# Patient Record
Sex: Female | Born: 1945
Health system: Southern US, Community
[De-identification: ages and names within clinical notes are randomized; demographics above are authoritative.]

## PROBLEM LIST (undated history)

## (undated) DIAGNOSIS — J449 Chronic obstructive pulmonary disease, unspecified: Secondary | ICD-10-CM

## (undated) DIAGNOSIS — F329 Major depressive disorder, single episode, unspecified: Secondary | ICD-10-CM

## (undated) DIAGNOSIS — F32A Depression, unspecified: Secondary | ICD-10-CM

## (undated) DIAGNOSIS — I10 Essential (primary) hypertension: Secondary | ICD-10-CM

## (undated) DIAGNOSIS — E785 Hyperlipidemia, unspecified: Secondary | ICD-10-CM

## (undated) DIAGNOSIS — N183 Chronic kidney disease, stage 3 unspecified: Secondary | ICD-10-CM

## (undated) DIAGNOSIS — M48061 Spinal stenosis, lumbar region without neurogenic claudication: Secondary | ICD-10-CM

## (undated) DIAGNOSIS — N8110 Cystocele, unspecified: Secondary | ICD-10-CM

## (undated) HISTORY — DX: Spinal stenosis, lumbar region without neurogenic claudication: M48.061

## (undated) HISTORY — DX: Hyperlipidemia, unspecified: E78.5

## (undated) HISTORY — DX: Depression, unspecified: F32.A

## (undated) HISTORY — PX: OTHER SURGICAL HISTORY: SHX169

## (undated) HISTORY — DX: Essential (primary) hypertension: I10

## (undated) HISTORY — PX: CERVICAL LAMINECTOMY: SHX94

## (undated) HISTORY — DX: Cystocele, unspecified: N81.10

## (undated) HISTORY — DX: Chronic obstructive pulmonary disease, unspecified: J44.9

## (undated) HISTORY — DX: Chronic kidney disease, stage 3 unspecified: N18.30

---
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
## 1998-04-24 ENCOUNTER — Ambulatory Visit (HOSPITAL_COMMUNITY): Admission: RE | Admit: 1998-04-24 | Discharge: 1998-04-24 | Payer: Self-pay | Admitting: Orthopedic Surgery

## 2011-10-29 DIAGNOSIS — Z1231 Encounter for screening mammogram for malignant neoplasm of breast: Secondary | ICD-10-CM | POA: Diagnosis not present

## 2011-11-16 DIAGNOSIS — L570 Actinic keratosis: Secondary | ICD-10-CM | POA: Diagnosis not present

## 2011-12-01 DIAGNOSIS — R5383 Other fatigue: Secondary | ICD-10-CM | POA: Diagnosis not present

## 2011-12-01 DIAGNOSIS — R5381 Other malaise: Secondary | ICD-10-CM | POA: Diagnosis not present

## 2011-12-02 DIAGNOSIS — H40019 Open angle with borderline findings, low risk, unspecified eye: Secondary | ICD-10-CM | POA: Diagnosis not present

## 2011-12-02 DIAGNOSIS — H04129 Dry eye syndrome of unspecified lacrimal gland: Secondary | ICD-10-CM | POA: Diagnosis not present

## 2011-12-07 DIAGNOSIS — N952 Postmenopausal atrophic vaginitis: Secondary | ICD-10-CM | POA: Diagnosis not present

## 2011-12-07 DIAGNOSIS — N814 Uterovaginal prolapse, unspecified: Secondary | ICD-10-CM | POA: Diagnosis not present

## 2011-12-09 DIAGNOSIS — Z0000 Encounter for general adult medical examination without abnormal findings: Secondary | ICD-10-CM | POA: Diagnosis not present

## 2011-12-13 DIAGNOSIS — E782 Mixed hyperlipidemia: Secondary | ICD-10-CM | POA: Diagnosis not present

## 2011-12-13 DIAGNOSIS — I10 Essential (primary) hypertension: Secondary | ICD-10-CM | POA: Diagnosis not present

## 2011-12-13 DIAGNOSIS — F341 Dysthymic disorder: Secondary | ICD-10-CM | POA: Diagnosis not present

## 2011-12-13 DIAGNOSIS — Z79899 Other long term (current) drug therapy: Secondary | ICD-10-CM | POA: Diagnosis not present

## 2011-12-13 DIAGNOSIS — Z1331 Encounter for screening for depression: Secondary | ICD-10-CM | POA: Diagnosis not present

## 2011-12-24 DIAGNOSIS — M5412 Radiculopathy, cervical region: Secondary | ICD-10-CM | POA: Diagnosis not present

## 2011-12-28 DIAGNOSIS — IMO0002 Reserved for concepts with insufficient information to code with codable children: Secondary | ICD-10-CM | POA: Diagnosis not present

## 2011-12-28 DIAGNOSIS — M9981 Other biomechanical lesions of cervical region: Secondary | ICD-10-CM | POA: Diagnosis not present

## 2011-12-28 DIAGNOSIS — M5030 Other cervical disc degeneration, unspecified cervical region: Secondary | ICD-10-CM | POA: Diagnosis not present

## 2011-12-28 DIAGNOSIS — M999 Biomechanical lesion, unspecified: Secondary | ICD-10-CM | POA: Diagnosis not present

## 2011-12-30 DIAGNOSIS — M9981 Other biomechanical lesions of cervical region: Secondary | ICD-10-CM | POA: Diagnosis not present

## 2011-12-30 DIAGNOSIS — M5030 Other cervical disc degeneration, unspecified cervical region: Secondary | ICD-10-CM | POA: Diagnosis not present

## 2011-12-30 DIAGNOSIS — M999 Biomechanical lesion, unspecified: Secondary | ICD-10-CM | POA: Diagnosis not present

## 2011-12-30 DIAGNOSIS — IMO0002 Reserved for concepts with insufficient information to code with codable children: Secondary | ICD-10-CM | POA: Diagnosis not present

## 2012-01-04 DIAGNOSIS — IMO0002 Reserved for concepts with insufficient information to code with codable children: Secondary | ICD-10-CM | POA: Diagnosis not present

## 2012-01-04 DIAGNOSIS — M9981 Other biomechanical lesions of cervical region: Secondary | ICD-10-CM | POA: Diagnosis not present

## 2012-01-04 DIAGNOSIS — Z981 Arthrodesis status: Secondary | ICD-10-CM | POA: Diagnosis not present

## 2012-01-04 DIAGNOSIS — M5030 Other cervical disc degeneration, unspecified cervical region: Secondary | ICD-10-CM | POA: Diagnosis not present

## 2012-01-04 DIAGNOSIS — M5020 Other cervical disc displacement, unspecified cervical region: Secondary | ICD-10-CM | POA: Diagnosis not present

## 2012-01-04 DIAGNOSIS — M999 Biomechanical lesion, unspecified: Secondary | ICD-10-CM | POA: Diagnosis not present

## 2012-01-04 DIAGNOSIS — M47812 Spondylosis without myelopathy or radiculopathy, cervical region: Secondary | ICD-10-CM | POA: Diagnosis not present

## 2012-01-05 DIAGNOSIS — Z0000 Encounter for general adult medical examination without abnormal findings: Secondary | ICD-10-CM | POA: Diagnosis not present

## 2012-01-05 DIAGNOSIS — Z01419 Encounter for gynecological examination (general) (routine) without abnormal findings: Secondary | ICD-10-CM | POA: Diagnosis not present

## 2012-01-05 DIAGNOSIS — Z1231 Encounter for screening mammogram for malignant neoplasm of breast: Secondary | ICD-10-CM | POA: Diagnosis not present

## 2012-01-10 DIAGNOSIS — F329 Major depressive disorder, single episode, unspecified: Secondary | ICD-10-CM | POA: Diagnosis not present

## 2012-01-10 DIAGNOSIS — M542 Cervicalgia: Secondary | ICD-10-CM | POA: Diagnosis not present

## 2012-01-10 DIAGNOSIS — Z130 Encounter for screening for diseases of the blood and blood-forming organs and certain disorders involving the immune mechanism: Secondary | ICD-10-CM | POA: Diagnosis not present

## 2012-01-10 DIAGNOSIS — Z1329 Encounter for screening for other suspected endocrine disorder: Secondary | ICD-10-CM | POA: Diagnosis not present

## 2012-01-10 DIAGNOSIS — F3289 Other specified depressive episodes: Secondary | ICD-10-CM | POA: Diagnosis not present

## 2012-01-10 DIAGNOSIS — E785 Hyperlipidemia, unspecified: Secondary | ICD-10-CM | POA: Diagnosis not present

## 2012-01-26 DIAGNOSIS — L570 Actinic keratosis: Secondary | ICD-10-CM | POA: Diagnosis not present

## 2012-02-02 DIAGNOSIS — R5383 Other fatigue: Secondary | ICD-10-CM | POA: Diagnosis not present

## 2012-02-02 DIAGNOSIS — R5381 Other malaise: Secondary | ICD-10-CM | POA: Diagnosis not present

## 2012-02-06 DIAGNOSIS — H81399 Other peripheral vertigo, unspecified ear: Secondary | ICD-10-CM | POA: Diagnosis not present

## 2012-02-15 DIAGNOSIS — J301 Allergic rhinitis due to pollen: Secondary | ICD-10-CM | POA: Diagnosis not present

## 2012-02-15 DIAGNOSIS — H8110 Benign paroxysmal vertigo, unspecified ear: Secondary | ICD-10-CM | POA: Diagnosis not present

## 2012-02-21 DIAGNOSIS — M545 Low back pain, unspecified: Secondary | ICD-10-CM | POA: Diagnosis not present

## 2012-02-23 DIAGNOSIS — H8110 Benign paroxysmal vertigo, unspecified ear: Secondary | ICD-10-CM | POA: Diagnosis not present

## 2012-02-23 DIAGNOSIS — IMO0001 Reserved for inherently not codable concepts without codable children: Secondary | ICD-10-CM | POA: Diagnosis not present

## 2012-02-23 DIAGNOSIS — R269 Unspecified abnormalities of gait and mobility: Secondary | ICD-10-CM | POA: Diagnosis not present

## 2012-02-29 DIAGNOSIS — Z1211 Encounter for screening for malignant neoplasm of colon: Secondary | ICD-10-CM | POA: Diagnosis not present

## 2012-02-29 DIAGNOSIS — N8182 Incompetence or weakening of pubocervical tissue: Secondary | ICD-10-CM | POA: Diagnosis not present

## 2012-02-29 DIAGNOSIS — N393 Stress incontinence (female) (male): Secondary | ICD-10-CM | POA: Diagnosis not present

## 2012-03-01 DIAGNOSIS — R5383 Other fatigue: Secondary | ICD-10-CM | POA: Diagnosis not present

## 2012-03-01 DIAGNOSIS — R5381 Other malaise: Secondary | ICD-10-CM | POA: Diagnosis not present

## 2012-03-02 DIAGNOSIS — IMO0002 Reserved for concepts with insufficient information to code with codable children: Secondary | ICD-10-CM | POA: Diagnosis not present

## 2012-03-02 DIAGNOSIS — M5030 Other cervical disc degeneration, unspecified cervical region: Secondary | ICD-10-CM | POA: Diagnosis not present

## 2012-03-02 DIAGNOSIS — M999 Biomechanical lesion, unspecified: Secondary | ICD-10-CM | POA: Diagnosis not present

## 2012-03-02 DIAGNOSIS — M9981 Other biomechanical lesions of cervical region: Secondary | ICD-10-CM | POA: Diagnosis not present

## 2012-03-04 DIAGNOSIS — IMO0002 Reserved for concepts with insufficient information to code with codable children: Secondary | ICD-10-CM | POA: Diagnosis not present

## 2012-03-04 DIAGNOSIS — M999 Biomechanical lesion, unspecified: Secondary | ICD-10-CM | POA: Diagnosis not present

## 2012-03-04 DIAGNOSIS — M5030 Other cervical disc degeneration, unspecified cervical region: Secondary | ICD-10-CM | POA: Diagnosis not present

## 2012-03-04 DIAGNOSIS — M9981 Other biomechanical lesions of cervical region: Secondary | ICD-10-CM | POA: Diagnosis not present

## 2012-03-07 DIAGNOSIS — IMO0002 Reserved for concepts with insufficient information to code with codable children: Secondary | ICD-10-CM | POA: Diagnosis not present

## 2012-03-07 DIAGNOSIS — M999 Biomechanical lesion, unspecified: Secondary | ICD-10-CM | POA: Diagnosis not present

## 2012-03-07 DIAGNOSIS — M9981 Other biomechanical lesions of cervical region: Secondary | ICD-10-CM | POA: Diagnosis not present

## 2012-03-07 DIAGNOSIS — M5030 Other cervical disc degeneration, unspecified cervical region: Secondary | ICD-10-CM | POA: Diagnosis not present

## 2012-03-25 DIAGNOSIS — R51 Headache: Secondary | ICD-10-CM | POA: Diagnosis not present

## 2012-03-25 DIAGNOSIS — H81399 Other peripheral vertigo, unspecified ear: Secondary | ICD-10-CM | POA: Diagnosis not present

## 2012-03-29 DIAGNOSIS — R51 Headache: Secondary | ICD-10-CM | POA: Diagnosis not present

## 2012-03-29 DIAGNOSIS — R42 Dizziness and giddiness: Secondary | ICD-10-CM | POA: Diagnosis not present

## 2012-04-03 DIAGNOSIS — H5310 Unspecified subjective visual disturbances: Secondary | ICD-10-CM | POA: Diagnosis not present

## 2012-04-03 DIAGNOSIS — H43819 Vitreous degeneration, unspecified eye: Secondary | ICD-10-CM | POA: Diagnosis not present

## 2012-04-03 DIAGNOSIS — H04129 Dry eye syndrome of unspecified lacrimal gland: Secondary | ICD-10-CM | POA: Diagnosis not present

## 2012-04-03 DIAGNOSIS — H52209 Unspecified astigmatism, unspecified eye: Secondary | ICD-10-CM | POA: Diagnosis not present

## 2012-04-06 DIAGNOSIS — E782 Mixed hyperlipidemia: Secondary | ICD-10-CM | POA: Diagnosis not present

## 2012-04-06 DIAGNOSIS — M999 Biomechanical lesion, unspecified: Secondary | ICD-10-CM | POA: Diagnosis not present

## 2012-04-06 DIAGNOSIS — F329 Major depressive disorder, single episode, unspecified: Secondary | ICD-10-CM | POA: Diagnosis not present

## 2012-04-06 DIAGNOSIS — M5030 Other cervical disc degeneration, unspecified cervical region: Secondary | ICD-10-CM | POA: Diagnosis not present

## 2012-04-06 DIAGNOSIS — I10 Essential (primary) hypertension: Secondary | ICD-10-CM | POA: Diagnosis not present

## 2012-04-06 DIAGNOSIS — IMO0002 Reserved for concepts with insufficient information to code with codable children: Secondary | ICD-10-CM | POA: Diagnosis not present

## 2012-04-06 DIAGNOSIS — M9981 Other biomechanical lesions of cervical region: Secondary | ICD-10-CM | POA: Diagnosis not present

## 2012-04-13 DIAGNOSIS — M5030 Other cervical disc degeneration, unspecified cervical region: Secondary | ICD-10-CM | POA: Diagnosis not present

## 2012-04-13 DIAGNOSIS — IMO0002 Reserved for concepts with insufficient information to code with codable children: Secondary | ICD-10-CM | POA: Diagnosis not present

## 2012-04-13 DIAGNOSIS — M999 Biomechanical lesion, unspecified: Secondary | ICD-10-CM | POA: Diagnosis not present

## 2012-04-13 DIAGNOSIS — M9981 Other biomechanical lesions of cervical region: Secondary | ICD-10-CM | POA: Diagnosis not present

## 2012-04-14 DIAGNOSIS — M5412 Radiculopathy, cervical region: Secondary | ICD-10-CM | POA: Diagnosis not present

## 2012-04-14 DIAGNOSIS — H8110 Benign paroxysmal vertigo, unspecified ear: Secondary | ICD-10-CM | POA: Diagnosis not present

## 2012-06-08 DIAGNOSIS — IMO0002 Reserved for concepts with insufficient information to code with codable children: Secondary | ICD-10-CM | POA: Diagnosis not present

## 2012-06-08 DIAGNOSIS — M999 Biomechanical lesion, unspecified: Secondary | ICD-10-CM | POA: Diagnosis not present

## 2012-06-08 DIAGNOSIS — M5030 Other cervical disc degeneration, unspecified cervical region: Secondary | ICD-10-CM | POA: Diagnosis not present

## 2012-06-08 DIAGNOSIS — M9981 Other biomechanical lesions of cervical region: Secondary | ICD-10-CM | POA: Diagnosis not present

## 2012-06-12 DIAGNOSIS — M999 Biomechanical lesion, unspecified: Secondary | ICD-10-CM | POA: Diagnosis not present

## 2012-06-12 DIAGNOSIS — IMO0002 Reserved for concepts with insufficient information to code with codable children: Secondary | ICD-10-CM | POA: Diagnosis not present

## 2012-06-12 DIAGNOSIS — M5030 Other cervical disc degeneration, unspecified cervical region: Secondary | ICD-10-CM | POA: Diagnosis not present

## 2012-06-12 DIAGNOSIS — M9981 Other biomechanical lesions of cervical region: Secondary | ICD-10-CM | POA: Diagnosis not present

## 2012-06-23 DIAGNOSIS — Z23 Encounter for immunization: Secondary | ICD-10-CM | POA: Diagnosis not present

## 2012-06-23 DIAGNOSIS — E782 Mixed hyperlipidemia: Secondary | ICD-10-CM | POA: Diagnosis not present

## 2012-06-23 DIAGNOSIS — F341 Dysthymic disorder: Secondary | ICD-10-CM | POA: Diagnosis not present

## 2012-06-23 DIAGNOSIS — R002 Palpitations: Secondary | ICD-10-CM | POA: Diagnosis not present

## 2012-06-29 DIAGNOSIS — J449 Chronic obstructive pulmonary disease, unspecified: Secondary | ICD-10-CM | POA: Diagnosis not present

## 2012-07-17 DIAGNOSIS — R5383 Other fatigue: Secondary | ICD-10-CM | POA: Diagnosis not present

## 2012-07-17 DIAGNOSIS — R5381 Other malaise: Secondary | ICD-10-CM | POA: Diagnosis not present

## 2012-07-25 DIAGNOSIS — R5381 Other malaise: Secondary | ICD-10-CM | POA: Diagnosis not present

## 2012-07-25 DIAGNOSIS — R5383 Other fatigue: Secondary | ICD-10-CM | POA: Diagnosis not present

## 2012-07-31 DIAGNOSIS — R5381 Other malaise: Secondary | ICD-10-CM | POA: Diagnosis not present

## 2012-08-28 DIAGNOSIS — R05 Cough: Secondary | ICD-10-CM | POA: Diagnosis not present

## 2012-08-28 DIAGNOSIS — F411 Generalized anxiety disorder: Secondary | ICD-10-CM | POA: Diagnosis not present

## 2012-08-28 DIAGNOSIS — J3089 Other allergic rhinitis: Secondary | ICD-10-CM | POA: Diagnosis not present

## 2012-08-28 DIAGNOSIS — R059 Cough, unspecified: Secondary | ICD-10-CM | POA: Diagnosis not present

## 2012-09-06 DIAGNOSIS — J3489 Other specified disorders of nose and nasal sinuses: Secondary | ICD-10-CM | POA: Diagnosis not present

## 2012-09-21 DIAGNOSIS — M5030 Other cervical disc degeneration, unspecified cervical region: Secondary | ICD-10-CM | POA: Diagnosis not present

## 2012-09-21 DIAGNOSIS — M999 Biomechanical lesion, unspecified: Secondary | ICD-10-CM | POA: Diagnosis not present

## 2012-09-21 DIAGNOSIS — IMO0002 Reserved for concepts with insufficient information to code with codable children: Secondary | ICD-10-CM | POA: Diagnosis not present

## 2012-09-21 DIAGNOSIS — M9981 Other biomechanical lesions of cervical region: Secondary | ICD-10-CM | POA: Diagnosis not present

## 2012-09-25 DIAGNOSIS — L738 Other specified follicular disorders: Secondary | ICD-10-CM | POA: Diagnosis not present

## 2012-09-25 DIAGNOSIS — L821 Other seborrheic keratosis: Secondary | ICD-10-CM | POA: Diagnosis not present

## 2012-09-25 DIAGNOSIS — L570 Actinic keratosis: Secondary | ICD-10-CM | POA: Diagnosis not present

## 2012-10-17 DIAGNOSIS — F411 Generalized anxiety disorder: Secondary | ICD-10-CM | POA: Diagnosis not present

## 2012-10-17 DIAGNOSIS — J019 Acute sinusitis, unspecified: Secondary | ICD-10-CM | POA: Diagnosis not present

## 2012-10-17 DIAGNOSIS — J3089 Other allergic rhinitis: Secondary | ICD-10-CM | POA: Diagnosis not present

## 2012-10-26 DIAGNOSIS — M5030 Other cervical disc degeneration, unspecified cervical region: Secondary | ICD-10-CM | POA: Diagnosis not present

## 2012-10-26 DIAGNOSIS — M9981 Other biomechanical lesions of cervical region: Secondary | ICD-10-CM | POA: Diagnosis not present

## 2012-10-26 DIAGNOSIS — IMO0002 Reserved for concepts with insufficient information to code with codable children: Secondary | ICD-10-CM | POA: Diagnosis not present

## 2012-10-26 DIAGNOSIS — M999 Biomechanical lesion, unspecified: Secondary | ICD-10-CM | POA: Diagnosis not present

## 2012-11-01 DIAGNOSIS — F341 Dysthymic disorder: Secondary | ICD-10-CM | POA: Diagnosis not present

## 2012-11-01 DIAGNOSIS — I10 Essential (primary) hypertension: Secondary | ICD-10-CM | POA: Diagnosis not present

## 2012-11-01 DIAGNOSIS — Z832 Family history of diseases of the blood and blood-forming organs and certain disorders involving the immune mechanism: Secondary | ICD-10-CM | POA: Diagnosis not present

## 2012-11-01 DIAGNOSIS — E7800 Pure hypercholesterolemia, unspecified: Secondary | ICD-10-CM | POA: Diagnosis not present

## 2012-11-01 DIAGNOSIS — E559 Vitamin D deficiency, unspecified: Secondary | ICD-10-CM | POA: Diagnosis not present

## 2012-11-01 DIAGNOSIS — I951 Orthostatic hypotension: Secondary | ICD-10-CM | POA: Diagnosis not present

## 2012-11-01 DIAGNOSIS — E782 Mixed hyperlipidemia: Secondary | ICD-10-CM | POA: Diagnosis not present

## 2012-11-01 DIAGNOSIS — Z006 Encounter for examination for normal comparison and control in clinical research program: Secondary | ICD-10-CM | POA: Diagnosis not present

## 2012-11-07 DIAGNOSIS — Z1231 Encounter for screening mammogram for malignant neoplasm of breast: Secondary | ICD-10-CM | POA: Diagnosis not present

## 2012-11-22 DIAGNOSIS — E782 Mixed hyperlipidemia: Secondary | ICD-10-CM | POA: Diagnosis not present

## 2012-11-22 DIAGNOSIS — I10 Essential (primary) hypertension: Secondary | ICD-10-CM | POA: Diagnosis not present

## 2012-12-14 DIAGNOSIS — M5030 Other cervical disc degeneration, unspecified cervical region: Secondary | ICD-10-CM | POA: Diagnosis not present

## 2012-12-14 DIAGNOSIS — M9981 Other biomechanical lesions of cervical region: Secondary | ICD-10-CM | POA: Diagnosis not present

## 2012-12-14 DIAGNOSIS — M999 Biomechanical lesion, unspecified: Secondary | ICD-10-CM | POA: Diagnosis not present

## 2012-12-14 DIAGNOSIS — IMO0002 Reserved for concepts with insufficient information to code with codable children: Secondary | ICD-10-CM | POA: Diagnosis not present

## 2012-12-25 DIAGNOSIS — R748 Abnormal levels of other serum enzymes: Secondary | ICD-10-CM | POA: Diagnosis not present

## 2012-12-29 DIAGNOSIS — J019 Acute sinusitis, unspecified: Secondary | ICD-10-CM | POA: Diagnosis not present

## 2013-01-02 DIAGNOSIS — IMO0002 Reserved for concepts with insufficient information to code with codable children: Secondary | ICD-10-CM | POA: Diagnosis not present

## 2013-01-02 DIAGNOSIS — M5030 Other cervical disc degeneration, unspecified cervical region: Secondary | ICD-10-CM | POA: Diagnosis not present

## 2013-01-02 DIAGNOSIS — M9981 Other biomechanical lesions of cervical region: Secondary | ICD-10-CM | POA: Diagnosis not present

## 2013-01-02 DIAGNOSIS — R5381 Other malaise: Secondary | ICD-10-CM | POA: Diagnosis not present

## 2013-01-02 DIAGNOSIS — M999 Biomechanical lesion, unspecified: Secondary | ICD-10-CM | POA: Diagnosis not present

## 2013-01-05 DIAGNOSIS — H04129 Dry eye syndrome of unspecified lacrimal gland: Secondary | ICD-10-CM | POA: Diagnosis not present

## 2013-01-05 DIAGNOSIS — H16109 Unspecified superficial keratitis, unspecified eye: Secondary | ICD-10-CM | POA: Diagnosis not present

## 2013-01-05 DIAGNOSIS — H538 Other visual disturbances: Secondary | ICD-10-CM | POA: Diagnosis not present

## 2013-01-05 DIAGNOSIS — Z961 Presence of intraocular lens: Secondary | ICD-10-CM | POA: Diagnosis not present

## 2013-01-11 DIAGNOSIS — M19079 Primary osteoarthritis, unspecified ankle and foot: Secondary | ICD-10-CM | POA: Diagnosis not present

## 2013-01-11 DIAGNOSIS — G5760 Lesion of plantar nerve, unspecified lower limb: Secondary | ICD-10-CM | POA: Diagnosis not present

## 2013-01-11 DIAGNOSIS — M779 Enthesopathy, unspecified: Secondary | ICD-10-CM | POA: Diagnosis not present

## 2013-01-25 DIAGNOSIS — G5760 Lesion of plantar nerve, unspecified lower limb: Secondary | ICD-10-CM | POA: Diagnosis not present

## 2013-02-15 DIAGNOSIS — IMO0002 Reserved for concepts with insufficient information to code with codable children: Secondary | ICD-10-CM | POA: Diagnosis not present

## 2013-02-15 DIAGNOSIS — M5030 Other cervical disc degeneration, unspecified cervical region: Secondary | ICD-10-CM | POA: Diagnosis not present

## 2013-02-15 DIAGNOSIS — M9981 Other biomechanical lesions of cervical region: Secondary | ICD-10-CM | POA: Diagnosis not present

## 2013-02-15 DIAGNOSIS — G5760 Lesion of plantar nerve, unspecified lower limb: Secondary | ICD-10-CM | POA: Diagnosis not present

## 2013-02-15 DIAGNOSIS — M999 Biomechanical lesion, unspecified: Secondary | ICD-10-CM | POA: Diagnosis not present

## 2013-02-19 DIAGNOSIS — L570 Actinic keratosis: Secondary | ICD-10-CM | POA: Diagnosis not present

## 2013-02-19 DIAGNOSIS — L819 Disorder of pigmentation, unspecified: Secondary | ICD-10-CM | POA: Diagnosis not present

## 2013-02-19 DIAGNOSIS — L820 Inflamed seborrheic keratosis: Secondary | ICD-10-CM | POA: Diagnosis not present

## 2013-02-22 DIAGNOSIS — M999 Biomechanical lesion, unspecified: Secondary | ICD-10-CM | POA: Diagnosis not present

## 2013-02-22 DIAGNOSIS — IMO0002 Reserved for concepts with insufficient information to code with codable children: Secondary | ICD-10-CM | POA: Diagnosis not present

## 2013-02-22 DIAGNOSIS — M5030 Other cervical disc degeneration, unspecified cervical region: Secondary | ICD-10-CM | POA: Diagnosis not present

## 2013-02-22 DIAGNOSIS — M9981 Other biomechanical lesions of cervical region: Secondary | ICD-10-CM | POA: Diagnosis not present

## 2013-03-22 DIAGNOSIS — M999 Biomechanical lesion, unspecified: Secondary | ICD-10-CM | POA: Diagnosis not present

## 2013-03-22 DIAGNOSIS — M5030 Other cervical disc degeneration, unspecified cervical region: Secondary | ICD-10-CM | POA: Diagnosis not present

## 2013-03-22 DIAGNOSIS — I10 Essential (primary) hypertension: Secondary | ICD-10-CM | POA: Diagnosis not present

## 2013-03-22 DIAGNOSIS — F329 Major depressive disorder, single episode, unspecified: Secondary | ICD-10-CM | POA: Diagnosis not present

## 2013-03-22 DIAGNOSIS — E782 Mixed hyperlipidemia: Secondary | ICD-10-CM | POA: Diagnosis not present

## 2013-03-22 DIAGNOSIS — IMO0002 Reserved for concepts with insufficient information to code with codable children: Secondary | ICD-10-CM | POA: Diagnosis not present

## 2013-03-22 DIAGNOSIS — M9981 Other biomechanical lesions of cervical region: Secondary | ICD-10-CM | POA: Diagnosis not present

## 2013-04-01 DIAGNOSIS — N3000 Acute cystitis without hematuria: Secondary | ICD-10-CM | POA: Diagnosis not present

## 2013-04-01 DIAGNOSIS — N390 Urinary tract infection, site not specified: Secondary | ICD-10-CM | POA: Diagnosis not present

## 2013-04-10 DIAGNOSIS — IMO0002 Reserved for concepts with insufficient information to code with codable children: Secondary | ICD-10-CM | POA: Diagnosis not present

## 2013-04-10 DIAGNOSIS — M5030 Other cervical disc degeneration, unspecified cervical region: Secondary | ICD-10-CM | POA: Diagnosis not present

## 2013-04-10 DIAGNOSIS — M999 Biomechanical lesion, unspecified: Secondary | ICD-10-CM | POA: Diagnosis not present

## 2013-04-10 DIAGNOSIS — M9981 Other biomechanical lesions of cervical region: Secondary | ICD-10-CM | POA: Diagnosis not present

## 2013-04-18 DIAGNOSIS — R42 Dizziness and giddiness: Secondary | ICD-10-CM | POA: Diagnosis not present

## 2013-04-18 DIAGNOSIS — J301 Allergic rhinitis due to pollen: Secondary | ICD-10-CM | POA: Diagnosis not present

## 2013-04-18 DIAGNOSIS — H6120 Impacted cerumen, unspecified ear: Secondary | ICD-10-CM | POA: Diagnosis not present

## 2013-04-18 DIAGNOSIS — N760 Acute vaginitis: Secondary | ICD-10-CM | POA: Diagnosis not present

## 2013-04-23 DIAGNOSIS — R630 Anorexia: Secondary | ICD-10-CM | POA: Diagnosis not present

## 2013-04-23 DIAGNOSIS — E785 Hyperlipidemia, unspecified: Secondary | ICD-10-CM | POA: Diagnosis not present

## 2013-04-23 DIAGNOSIS — Z79899 Other long term (current) drug therapy: Secondary | ICD-10-CM | POA: Diagnosis not present

## 2013-04-23 DIAGNOSIS — R42 Dizziness and giddiness: Secondary | ICD-10-CM | POA: Diagnosis not present

## 2013-04-23 DIAGNOSIS — J449 Chronic obstructive pulmonary disease, unspecified: Secondary | ICD-10-CM | POA: Diagnosis not present

## 2013-04-23 DIAGNOSIS — R112 Nausea with vomiting, unspecified: Secondary | ICD-10-CM | POA: Diagnosis not present

## 2013-04-24 DIAGNOSIS — N8111 Cystocele, midline: Secondary | ICD-10-CM | POA: Diagnosis not present

## 2013-04-24 DIAGNOSIS — Z4689 Encounter for fitting and adjustment of other specified devices: Secondary | ICD-10-CM | POA: Diagnosis not present

## 2013-04-26 DIAGNOSIS — Z4689 Encounter for fitting and adjustment of other specified devices: Secondary | ICD-10-CM | POA: Diagnosis not present

## 2013-05-02 DIAGNOSIS — R42 Dizziness and giddiness: Secondary | ICD-10-CM | POA: Diagnosis not present

## 2013-05-07 DIAGNOSIS — R5383 Other fatigue: Secondary | ICD-10-CM | POA: Diagnosis not present

## 2013-05-07 DIAGNOSIS — R5381 Other malaise: Secondary | ICD-10-CM | POA: Diagnosis not present

## 2013-05-10 DIAGNOSIS — IMO0002 Reserved for concepts with insufficient information to code with codable children: Secondary | ICD-10-CM | POA: Diagnosis not present

## 2013-05-10 DIAGNOSIS — M999 Biomechanical lesion, unspecified: Secondary | ICD-10-CM | POA: Diagnosis not present

## 2013-05-10 DIAGNOSIS — M9981 Other biomechanical lesions of cervical region: Secondary | ICD-10-CM | POA: Diagnosis not present

## 2013-05-10 DIAGNOSIS — M5030 Other cervical disc degeneration, unspecified cervical region: Secondary | ICD-10-CM | POA: Diagnosis not present

## 2013-05-28 DIAGNOSIS — M5030 Other cervical disc degeneration, unspecified cervical region: Secondary | ICD-10-CM | POA: Diagnosis not present

## 2013-05-28 DIAGNOSIS — M999 Biomechanical lesion, unspecified: Secondary | ICD-10-CM | POA: Diagnosis not present

## 2013-05-28 DIAGNOSIS — M9981 Other biomechanical lesions of cervical region: Secondary | ICD-10-CM | POA: Diagnosis not present

## 2013-05-28 DIAGNOSIS — IMO0002 Reserved for concepts with insufficient information to code with codable children: Secondary | ICD-10-CM | POA: Diagnosis not present

## 2013-05-29 DIAGNOSIS — M999 Biomechanical lesion, unspecified: Secondary | ICD-10-CM | POA: Diagnosis not present

## 2013-05-29 DIAGNOSIS — M9981 Other biomechanical lesions of cervical region: Secondary | ICD-10-CM | POA: Diagnosis not present

## 2013-05-29 DIAGNOSIS — M5030 Other cervical disc degeneration, unspecified cervical region: Secondary | ICD-10-CM | POA: Diagnosis not present

## 2013-05-29 DIAGNOSIS — IMO0002 Reserved for concepts with insufficient information to code with codable children: Secondary | ICD-10-CM | POA: Diagnosis not present

## 2013-05-30 DIAGNOSIS — N8111 Cystocele, midline: Secondary | ICD-10-CM | POA: Diagnosis not present

## 2013-05-30 DIAGNOSIS — Z4689 Encounter for fitting and adjustment of other specified devices: Secondary | ICD-10-CM | POA: Diagnosis not present

## 2013-06-14 DIAGNOSIS — IMO0002 Reserved for concepts with insufficient information to code with codable children: Secondary | ICD-10-CM | POA: Diagnosis not present

## 2013-06-14 DIAGNOSIS — M999 Biomechanical lesion, unspecified: Secondary | ICD-10-CM | POA: Diagnosis not present

## 2013-06-14 DIAGNOSIS — M5030 Other cervical disc degeneration, unspecified cervical region: Secondary | ICD-10-CM | POA: Diagnosis not present

## 2013-06-14 DIAGNOSIS — M9981 Other biomechanical lesions of cervical region: Secondary | ICD-10-CM | POA: Diagnosis not present

## 2013-07-12 DIAGNOSIS — M25549 Pain in joints of unspecified hand: Secondary | ICD-10-CM | POA: Diagnosis not present

## 2013-07-12 DIAGNOSIS — IMO0002 Reserved for concepts with insufficient information to code with codable children: Secondary | ICD-10-CM | POA: Diagnosis not present

## 2013-07-12 DIAGNOSIS — M999 Biomechanical lesion, unspecified: Secondary | ICD-10-CM | POA: Diagnosis not present

## 2013-07-12 DIAGNOSIS — Z0000 Encounter for general adult medical examination without abnormal findings: Secondary | ICD-10-CM | POA: Diagnosis not present

## 2013-07-12 DIAGNOSIS — M5030 Other cervical disc degeneration, unspecified cervical region: Secondary | ICD-10-CM | POA: Diagnosis not present

## 2013-07-12 DIAGNOSIS — Z1211 Encounter for screening for malignant neoplasm of colon: Secondary | ICD-10-CM | POA: Diagnosis not present

## 2013-07-12 DIAGNOSIS — M9981 Other biomechanical lesions of cervical region: Secondary | ICD-10-CM | POA: Diagnosis not present

## 2013-07-12 DIAGNOSIS — Z1231 Encounter for screening mammogram for malignant neoplasm of breast: Secondary | ICD-10-CM | POA: Diagnosis not present

## 2013-07-12 DIAGNOSIS — Z23 Encounter for immunization: Secondary | ICD-10-CM | POA: Diagnosis not present

## 2013-07-12 DIAGNOSIS — Z1331 Encounter for screening for depression: Secondary | ICD-10-CM | POA: Diagnosis not present

## 2013-07-17 DIAGNOSIS — I10 Essential (primary) hypertension: Secondary | ICD-10-CM | POA: Diagnosis not present

## 2013-07-17 DIAGNOSIS — E782 Mixed hyperlipidemia: Secondary | ICD-10-CM | POA: Diagnosis not present

## 2013-07-24 DIAGNOSIS — M5030 Other cervical disc degeneration, unspecified cervical region: Secondary | ICD-10-CM | POA: Diagnosis not present

## 2013-07-24 DIAGNOSIS — IMO0002 Reserved for concepts with insufficient information to code with codable children: Secondary | ICD-10-CM | POA: Diagnosis not present

## 2013-07-24 DIAGNOSIS — M9981 Other biomechanical lesions of cervical region: Secondary | ICD-10-CM | POA: Diagnosis not present

## 2013-07-24 DIAGNOSIS — M999 Biomechanical lesion, unspecified: Secondary | ICD-10-CM | POA: Diagnosis not present

## 2013-07-24 DIAGNOSIS — Z1231 Encounter for screening mammogram for malignant neoplasm of breast: Secondary | ICD-10-CM | POA: Diagnosis not present

## 2013-07-28 DIAGNOSIS — M999 Biomechanical lesion, unspecified: Secondary | ICD-10-CM | POA: Diagnosis not present

## 2013-07-28 DIAGNOSIS — IMO0002 Reserved for concepts with insufficient information to code with codable children: Secondary | ICD-10-CM | POA: Diagnosis not present

## 2013-07-28 DIAGNOSIS — M9981 Other biomechanical lesions of cervical region: Secondary | ICD-10-CM | POA: Diagnosis not present

## 2013-07-28 DIAGNOSIS — M5030 Other cervical disc degeneration, unspecified cervical region: Secondary | ICD-10-CM | POA: Diagnosis not present

## 2013-08-23 DIAGNOSIS — R928 Other abnormal and inconclusive findings on diagnostic imaging of breast: Secondary | ICD-10-CM | POA: Diagnosis not present

## 2013-08-27 DIAGNOSIS — E782 Mixed hyperlipidemia: Secondary | ICD-10-CM | POA: Diagnosis not present

## 2013-08-27 DIAGNOSIS — Z1389 Encounter for screening for other disorder: Secondary | ICD-10-CM | POA: Diagnosis not present

## 2013-08-27 DIAGNOSIS — J301 Allergic rhinitis due to pollen: Secondary | ICD-10-CM | POA: Diagnosis not present

## 2013-08-27 DIAGNOSIS — I10 Essential (primary) hypertension: Secondary | ICD-10-CM | POA: Diagnosis not present

## 2013-09-04 DIAGNOSIS — J449 Chronic obstructive pulmonary disease, unspecified: Secondary | ICD-10-CM | POA: Diagnosis not present

## 2013-09-19 DIAGNOSIS — N898 Other specified noninflammatory disorders of vagina: Secondary | ICD-10-CM | POA: Diagnosis not present

## 2013-09-19 DIAGNOSIS — R319 Hematuria, unspecified: Secondary | ICD-10-CM | POA: Diagnosis not present

## 2013-09-19 DIAGNOSIS — R82998 Other abnormal findings in urine: Secondary | ICD-10-CM | POA: Diagnosis not present

## 2013-09-20 DIAGNOSIS — M5030 Other cervical disc degeneration, unspecified cervical region: Secondary | ICD-10-CM | POA: Diagnosis not present

## 2013-09-20 DIAGNOSIS — IMO0002 Reserved for concepts with insufficient information to code with codable children: Secondary | ICD-10-CM | POA: Diagnosis not present

## 2013-09-20 DIAGNOSIS — M999 Biomechanical lesion, unspecified: Secondary | ICD-10-CM | POA: Diagnosis not present

## 2013-09-20 DIAGNOSIS — M9981 Other biomechanical lesions of cervical region: Secondary | ICD-10-CM | POA: Diagnosis not present

## 2013-10-26 DIAGNOSIS — Z006 Encounter for examination for normal comparison and control in clinical research program: Secondary | ICD-10-CM | POA: Diagnosis not present

## 2013-10-26 DIAGNOSIS — F411 Generalized anxiety disorder: Secondary | ICD-10-CM | POA: Diagnosis not present

## 2013-10-26 DIAGNOSIS — E785 Hyperlipidemia, unspecified: Secondary | ICD-10-CM | POA: Diagnosis not present

## 2013-10-31 DIAGNOSIS — L570 Actinic keratosis: Secondary | ICD-10-CM | POA: Diagnosis not present

## 2013-10-31 DIAGNOSIS — K130 Diseases of lips: Secondary | ICD-10-CM | POA: Diagnosis not present

## 2013-11-05 DIAGNOSIS — H16109 Unspecified superficial keratitis, unspecified eye: Secondary | ICD-10-CM | POA: Diagnosis not present

## 2013-11-05 DIAGNOSIS — H04129 Dry eye syndrome of unspecified lacrimal gland: Secondary | ICD-10-CM | POA: Diagnosis not present

## 2013-11-08 DIAGNOSIS — IMO0002 Reserved for concepts with insufficient information to code with codable children: Secondary | ICD-10-CM | POA: Diagnosis not present

## 2013-11-08 DIAGNOSIS — M9981 Other biomechanical lesions of cervical region: Secondary | ICD-10-CM | POA: Diagnosis not present

## 2013-11-08 DIAGNOSIS — M999 Biomechanical lesion, unspecified: Secondary | ICD-10-CM | POA: Diagnosis not present

## 2013-11-08 DIAGNOSIS — M5030 Other cervical disc degeneration, unspecified cervical region: Secondary | ICD-10-CM | POA: Diagnosis not present

## 2013-11-13 DIAGNOSIS — M999 Biomechanical lesion, unspecified: Secondary | ICD-10-CM | POA: Diagnosis not present

## 2013-11-13 DIAGNOSIS — M9981 Other biomechanical lesions of cervical region: Secondary | ICD-10-CM | POA: Diagnosis not present

## 2013-11-13 DIAGNOSIS — IMO0002 Reserved for concepts with insufficient information to code with codable children: Secondary | ICD-10-CM | POA: Diagnosis not present

## 2013-11-13 DIAGNOSIS — M5030 Other cervical disc degeneration, unspecified cervical region: Secondary | ICD-10-CM | POA: Diagnosis not present

## 2013-11-14 DIAGNOSIS — R059 Cough, unspecified: Secondary | ICD-10-CM | POA: Diagnosis not present

## 2013-11-14 DIAGNOSIS — J029 Acute pharyngitis, unspecified: Secondary | ICD-10-CM | POA: Diagnosis not present

## 2013-11-14 DIAGNOSIS — J069 Acute upper respiratory infection, unspecified: Secondary | ICD-10-CM | POA: Diagnosis not present

## 2013-11-14 DIAGNOSIS — R05 Cough: Secondary | ICD-10-CM | POA: Diagnosis not present

## 2013-11-22 DIAGNOSIS — M5030 Other cervical disc degeneration, unspecified cervical region: Secondary | ICD-10-CM | POA: Diagnosis not present

## 2013-11-22 DIAGNOSIS — IMO0002 Reserved for concepts with insufficient information to code with codable children: Secondary | ICD-10-CM | POA: Diagnosis not present

## 2013-11-22 DIAGNOSIS — M999 Biomechanical lesion, unspecified: Secondary | ICD-10-CM | POA: Diagnosis not present

## 2013-11-22 DIAGNOSIS — M9981 Other biomechanical lesions of cervical region: Secondary | ICD-10-CM | POA: Diagnosis not present

## 2013-12-11 DIAGNOSIS — M9981 Other biomechanical lesions of cervical region: Secondary | ICD-10-CM | POA: Diagnosis not present

## 2013-12-11 DIAGNOSIS — M999 Biomechanical lesion, unspecified: Secondary | ICD-10-CM | POA: Diagnosis not present

## 2013-12-11 DIAGNOSIS — IMO0002 Reserved for concepts with insufficient information to code with codable children: Secondary | ICD-10-CM | POA: Diagnosis not present

## 2013-12-11 DIAGNOSIS — M5030 Other cervical disc degeneration, unspecified cervical region: Secondary | ICD-10-CM | POA: Diagnosis not present

## 2014-01-10 DIAGNOSIS — M999 Biomechanical lesion, unspecified: Secondary | ICD-10-CM | POA: Diagnosis not present

## 2014-01-10 DIAGNOSIS — M5030 Other cervical disc degeneration, unspecified cervical region: Secondary | ICD-10-CM | POA: Diagnosis not present

## 2014-01-10 DIAGNOSIS — M9981 Other biomechanical lesions of cervical region: Secondary | ICD-10-CM | POA: Diagnosis not present

## 2014-01-10 DIAGNOSIS — IMO0002 Reserved for concepts with insufficient information to code with codable children: Secondary | ICD-10-CM | POA: Diagnosis not present

## 2014-01-12 DIAGNOSIS — J069 Acute upper respiratory infection, unspecified: Secondary | ICD-10-CM | POA: Diagnosis not present

## 2014-01-28 DIAGNOSIS — E782 Mixed hyperlipidemia: Secondary | ICD-10-CM | POA: Diagnosis not present

## 2014-01-28 DIAGNOSIS — I10 Essential (primary) hypertension: Secondary | ICD-10-CM | POA: Diagnosis not present

## 2014-02-01 DIAGNOSIS — M545 Low back pain, unspecified: Secondary | ICD-10-CM | POA: Diagnosis not present

## 2014-02-01 DIAGNOSIS — E782 Mixed hyperlipidemia: Secondary | ICD-10-CM | POA: Diagnosis not present

## 2014-02-01 DIAGNOSIS — IMO0002 Reserved for concepts with insufficient information to code with codable children: Secondary | ICD-10-CM | POA: Diagnosis not present

## 2014-02-01 DIAGNOSIS — I10 Essential (primary) hypertension: Secondary | ICD-10-CM | POA: Diagnosis not present

## 2014-02-06 DIAGNOSIS — M545 Low back pain, unspecified: Secondary | ICD-10-CM | POA: Diagnosis not present

## 2014-02-06 DIAGNOSIS — M546 Pain in thoracic spine: Secondary | ICD-10-CM | POA: Diagnosis not present

## 2014-02-06 DIAGNOSIS — M2580 Other specified joint disorders, unspecified joint: Secondary | ICD-10-CM | POA: Diagnosis not present

## 2014-02-19 DIAGNOSIS — M2580 Other specified joint disorders, unspecified joint: Secondary | ICD-10-CM | POA: Diagnosis not present

## 2014-02-19 DIAGNOSIS — M545 Low back pain, unspecified: Secondary | ICD-10-CM | POA: Diagnosis not present

## 2014-02-19 DIAGNOSIS — M546 Pain in thoracic spine: Secondary | ICD-10-CM | POA: Diagnosis not present

## 2014-02-26 DIAGNOSIS — M545 Low back pain, unspecified: Secondary | ICD-10-CM | POA: Diagnosis not present

## 2014-02-26 DIAGNOSIS — M546 Pain in thoracic spine: Secondary | ICD-10-CM | POA: Diagnosis not present

## 2014-02-26 DIAGNOSIS — M2580 Other specified joint disorders, unspecified joint: Secondary | ICD-10-CM | POA: Diagnosis not present

## 2014-03-28 DIAGNOSIS — M5030 Other cervical disc degeneration, unspecified cervical region: Secondary | ICD-10-CM | POA: Diagnosis not present

## 2014-03-28 DIAGNOSIS — IMO0002 Reserved for concepts with insufficient information to code with codable children: Secondary | ICD-10-CM | POA: Diagnosis not present

## 2014-03-28 DIAGNOSIS — M999 Biomechanical lesion, unspecified: Secondary | ICD-10-CM | POA: Diagnosis not present

## 2014-03-28 DIAGNOSIS — M9981 Other biomechanical lesions of cervical region: Secondary | ICD-10-CM | POA: Diagnosis not present

## 2014-04-02 DIAGNOSIS — L570 Actinic keratosis: Secondary | ICD-10-CM | POA: Diagnosis not present

## 2014-04-10 DIAGNOSIS — F329 Major depressive disorder, single episode, unspecified: Secondary | ICD-10-CM | POA: Diagnosis not present

## 2014-04-10 DIAGNOSIS — N814 Uterovaginal prolapse, unspecified: Secondary | ICD-10-CM | POA: Diagnosis not present

## 2014-04-10 DIAGNOSIS — F3289 Other specified depressive episodes: Secondary | ICD-10-CM | POA: Diagnosis not present

## 2014-05-02 DIAGNOSIS — IMO0002 Reserved for concepts with insufficient information to code with codable children: Secondary | ICD-10-CM | POA: Diagnosis not present

## 2014-05-02 DIAGNOSIS — M999 Biomechanical lesion, unspecified: Secondary | ICD-10-CM | POA: Diagnosis not present

## 2014-05-02 DIAGNOSIS — M9981 Other biomechanical lesions of cervical region: Secondary | ICD-10-CM | POA: Diagnosis not present

## 2014-05-02 DIAGNOSIS — M5030 Other cervical disc degeneration, unspecified cervical region: Secondary | ICD-10-CM | POA: Diagnosis not present

## 2014-05-09 DIAGNOSIS — M9981 Other biomechanical lesions of cervical region: Secondary | ICD-10-CM | POA: Diagnosis not present

## 2014-05-09 DIAGNOSIS — IMO0002 Reserved for concepts with insufficient information to code with codable children: Secondary | ICD-10-CM | POA: Diagnosis not present

## 2014-05-09 DIAGNOSIS — M5030 Other cervical disc degeneration, unspecified cervical region: Secondary | ICD-10-CM | POA: Diagnosis not present

## 2014-05-09 DIAGNOSIS — M999 Biomechanical lesion, unspecified: Secondary | ICD-10-CM | POA: Diagnosis not present

## 2014-05-20 DIAGNOSIS — Z139 Encounter for screening, unspecified: Secondary | ICD-10-CM | POA: Diagnosis not present

## 2014-05-20 DIAGNOSIS — Z0000 Encounter for general adult medical examination without abnormal findings: Secondary | ICD-10-CM | POA: Diagnosis not present

## 2014-05-20 DIAGNOSIS — Z1389 Encounter for screening for other disorder: Secondary | ICD-10-CM | POA: Diagnosis not present

## 2014-05-20 DIAGNOSIS — Z1331 Encounter for screening for depression: Secondary | ICD-10-CM | POA: Diagnosis not present

## 2014-05-21 DIAGNOSIS — I10 Essential (primary) hypertension: Secondary | ICD-10-CM | POA: Diagnosis not present

## 2014-05-21 DIAGNOSIS — E782 Mixed hyperlipidemia: Secondary | ICD-10-CM | POA: Diagnosis not present

## 2014-05-28 DIAGNOSIS — I10 Essential (primary) hypertension: Secondary | ICD-10-CM | POA: Diagnosis not present

## 2014-05-28 DIAGNOSIS — F341 Dysthymic disorder: Secondary | ICD-10-CM | POA: Diagnosis not present

## 2014-05-28 DIAGNOSIS — E782 Mixed hyperlipidemia: Secondary | ICD-10-CM | POA: Diagnosis not present

## 2014-05-28 DIAGNOSIS — IMO0002 Reserved for concepts with insufficient information to code with codable children: Secondary | ICD-10-CM | POA: Diagnosis not present

## 2014-05-30 DIAGNOSIS — M545 Low back pain, unspecified: Secondary | ICD-10-CM | POA: Diagnosis not present

## 2014-05-30 DIAGNOSIS — M2580 Other specified joint disorders, unspecified joint: Secondary | ICD-10-CM | POA: Diagnosis not present

## 2014-05-30 DIAGNOSIS — M546 Pain in thoracic spine: Secondary | ICD-10-CM | POA: Diagnosis not present

## 2014-06-04 DIAGNOSIS — M546 Pain in thoracic spine: Secondary | ICD-10-CM | POA: Diagnosis not present

## 2014-06-04 DIAGNOSIS — M545 Low back pain, unspecified: Secondary | ICD-10-CM | POA: Diagnosis not present

## 2014-06-04 DIAGNOSIS — M2580 Other specified joint disorders, unspecified joint: Secondary | ICD-10-CM | POA: Diagnosis not present

## 2014-06-04 DIAGNOSIS — M549 Dorsalgia, unspecified: Secondary | ICD-10-CM | POA: Diagnosis not present

## 2014-06-06 DIAGNOSIS — M545 Low back pain, unspecified: Secondary | ICD-10-CM | POA: Diagnosis not present

## 2014-06-06 DIAGNOSIS — M546 Pain in thoracic spine: Secondary | ICD-10-CM | POA: Diagnosis not present

## 2014-06-06 DIAGNOSIS — M2580 Other specified joint disorders, unspecified joint: Secondary | ICD-10-CM | POA: Diagnosis not present

## 2014-06-06 DIAGNOSIS — M549 Dorsalgia, unspecified: Secondary | ICD-10-CM | POA: Diagnosis not present

## 2014-06-11 DIAGNOSIS — M5030 Other cervical disc degeneration, unspecified cervical region: Secondary | ICD-10-CM | POA: Diagnosis not present

## 2014-06-11 DIAGNOSIS — M546 Pain in thoracic spine: Secondary | ICD-10-CM | POA: Diagnosis not present

## 2014-06-11 DIAGNOSIS — M545 Low back pain, unspecified: Secondary | ICD-10-CM | POA: Diagnosis not present

## 2014-06-11 DIAGNOSIS — IMO0002 Reserved for concepts with insufficient information to code with codable children: Secondary | ICD-10-CM | POA: Diagnosis not present

## 2014-06-11 DIAGNOSIS — M2580 Other specified joint disorders, unspecified joint: Secondary | ICD-10-CM | POA: Diagnosis not present

## 2014-06-11 DIAGNOSIS — M9981 Other biomechanical lesions of cervical region: Secondary | ICD-10-CM | POA: Diagnosis not present

## 2014-06-11 DIAGNOSIS — M549 Dorsalgia, unspecified: Secondary | ICD-10-CM | POA: Diagnosis not present

## 2014-06-11 DIAGNOSIS — M999 Biomechanical lesion, unspecified: Secondary | ICD-10-CM | POA: Diagnosis not present

## 2014-06-13 DIAGNOSIS — M545 Low back pain, unspecified: Secondary | ICD-10-CM | POA: Diagnosis not present

## 2014-06-13 DIAGNOSIS — M546 Pain in thoracic spine: Secondary | ICD-10-CM | POA: Diagnosis not present

## 2014-06-13 DIAGNOSIS — M549 Dorsalgia, unspecified: Secondary | ICD-10-CM | POA: Diagnosis not present

## 2014-06-13 DIAGNOSIS — M2580 Other specified joint disorders, unspecified joint: Secondary | ICD-10-CM | POA: Diagnosis not present

## 2014-07-03 DIAGNOSIS — M5030 Other cervical disc degeneration, unspecified cervical region: Secondary | ICD-10-CM | POA: Diagnosis not present

## 2014-07-03 DIAGNOSIS — IMO0002 Reserved for concepts with insufficient information to code with codable children: Secondary | ICD-10-CM | POA: Diagnosis not present

## 2014-07-03 DIAGNOSIS — M999 Biomechanical lesion, unspecified: Secondary | ICD-10-CM | POA: Diagnosis not present

## 2014-07-03 DIAGNOSIS — M9981 Other biomechanical lesions of cervical region: Secondary | ICD-10-CM | POA: Diagnosis not present

## 2014-07-31 DIAGNOSIS — F419 Anxiety disorder, unspecified: Secondary | ICD-10-CM | POA: Diagnosis not present

## 2014-07-31 DIAGNOSIS — J309 Allergic rhinitis, unspecified: Secondary | ICD-10-CM | POA: Diagnosis not present

## 2014-07-31 DIAGNOSIS — G4700 Insomnia, unspecified: Secondary | ICD-10-CM | POA: Diagnosis not present

## 2014-08-07 DIAGNOSIS — L820 Inflamed seborrheic keratosis: Secondary | ICD-10-CM | POA: Diagnosis not present

## 2014-08-21 DIAGNOSIS — I10 Essential (primary) hypertension: Secondary | ICD-10-CM | POA: Diagnosis not present

## 2014-08-21 DIAGNOSIS — Z23 Encounter for immunization: Secondary | ICD-10-CM | POA: Diagnosis not present

## 2014-08-21 DIAGNOSIS — E785 Hyperlipidemia, unspecified: Secondary | ICD-10-CM | POA: Diagnosis not present

## 2014-08-26 DIAGNOSIS — H16103 Unspecified superficial keratitis, bilateral: Secondary | ICD-10-CM | POA: Diagnosis not present

## 2014-08-26 DIAGNOSIS — Z961 Presence of intraocular lens: Secondary | ICD-10-CM | POA: Diagnosis not present

## 2014-08-26 DIAGNOSIS — H04123 Dry eye syndrome of bilateral lacrimal glands: Secondary | ICD-10-CM | POA: Diagnosis not present

## 2014-08-26 DIAGNOSIS — H5310 Unspecified subjective visual disturbances: Secondary | ICD-10-CM | POA: Diagnosis not present

## 2014-08-28 DIAGNOSIS — I10 Essential (primary) hypertension: Secondary | ICD-10-CM | POA: Diagnosis not present

## 2014-08-28 DIAGNOSIS — G8929 Other chronic pain: Secondary | ICD-10-CM | POA: Diagnosis not present

## 2014-08-28 DIAGNOSIS — M545 Low back pain: Secondary | ICD-10-CM | POA: Diagnosis not present

## 2014-08-28 DIAGNOSIS — E785 Hyperlipidemia, unspecified: Secondary | ICD-10-CM | POA: Diagnosis not present

## 2014-10-03 DIAGNOSIS — L820 Inflamed seborrheic keratosis: Secondary | ICD-10-CM | POA: Diagnosis not present

## 2014-10-21 DIAGNOSIS — N814 Uterovaginal prolapse, unspecified: Secondary | ICD-10-CM | POA: Diagnosis not present

## 2014-10-21 DIAGNOSIS — N8189 Other female genital prolapse: Secondary | ICD-10-CM | POA: Diagnosis not present

## 2014-11-14 DIAGNOSIS — M9903 Segmental and somatic dysfunction of lumbar region: Secondary | ICD-10-CM | POA: Diagnosis not present

## 2014-11-14 DIAGNOSIS — M5135 Other intervertebral disc degeneration, thoracolumbar region: Secondary | ICD-10-CM | POA: Diagnosis not present

## 2014-11-14 DIAGNOSIS — M9901 Segmental and somatic dysfunction of cervical region: Secondary | ICD-10-CM | POA: Diagnosis not present

## 2014-11-14 DIAGNOSIS — M9902 Segmental and somatic dysfunction of thoracic region: Secondary | ICD-10-CM | POA: Diagnosis not present

## 2014-11-14 DIAGNOSIS — M5030 Other cervical disc degeneration, unspecified cervical region: Secondary | ICD-10-CM | POA: Diagnosis not present

## 2014-11-18 DIAGNOSIS — H04121 Dry eye syndrome of right lacrimal gland: Secondary | ICD-10-CM | POA: Diagnosis not present

## 2014-11-18 DIAGNOSIS — H04122 Dry eye syndrome of left lacrimal gland: Secondary | ICD-10-CM | POA: Diagnosis not present

## 2014-11-19 DIAGNOSIS — M9901 Segmental and somatic dysfunction of cervical region: Secondary | ICD-10-CM | POA: Diagnosis not present

## 2014-11-19 DIAGNOSIS — E785 Hyperlipidemia, unspecified: Secondary | ICD-10-CM | POA: Diagnosis not present

## 2014-11-19 DIAGNOSIS — M5135 Other intervertebral disc degeneration, thoracolumbar region: Secondary | ICD-10-CM | POA: Diagnosis not present

## 2014-11-19 DIAGNOSIS — I10 Essential (primary) hypertension: Secondary | ICD-10-CM | POA: Diagnosis not present

## 2014-11-19 DIAGNOSIS — M9902 Segmental and somatic dysfunction of thoracic region: Secondary | ICD-10-CM | POA: Diagnosis not present

## 2014-11-19 DIAGNOSIS — M5030 Other cervical disc degeneration, unspecified cervical region: Secondary | ICD-10-CM | POA: Diagnosis not present

## 2014-11-19 DIAGNOSIS — M9903 Segmental and somatic dysfunction of lumbar region: Secondary | ICD-10-CM | POA: Diagnosis not present

## 2014-11-25 DIAGNOSIS — I129 Hypertensive chronic kidney disease with stage 1 through stage 4 chronic kidney disease, or unspecified chronic kidney disease: Secondary | ICD-10-CM | POA: Diagnosis not present

## 2014-11-25 DIAGNOSIS — M5417 Radiculopathy, lumbosacral region: Secondary | ICD-10-CM | POA: Diagnosis not present

## 2014-11-25 DIAGNOSIS — N182 Chronic kidney disease, stage 2 (mild): Secondary | ICD-10-CM | POA: Diagnosis not present

## 2014-11-25 DIAGNOSIS — E785 Hyperlipidemia, unspecified: Secondary | ICD-10-CM | POA: Diagnosis not present

## 2014-11-29 DIAGNOSIS — M545 Low back pain: Secondary | ICD-10-CM | POA: Diagnosis not present

## 2014-12-02 DIAGNOSIS — Z1231 Encounter for screening mammogram for malignant neoplasm of breast: Secondary | ICD-10-CM | POA: Diagnosis not present

## 2014-12-03 DIAGNOSIS — M5416 Radiculopathy, lumbar region: Secondary | ICD-10-CM | POA: Diagnosis not present

## 2014-12-03 DIAGNOSIS — M4806 Spinal stenosis, lumbar region: Secondary | ICD-10-CM | POA: Diagnosis not present

## 2014-12-04 DIAGNOSIS — M545 Low back pain: Secondary | ICD-10-CM | POA: Diagnosis not present

## 2014-12-06 DIAGNOSIS — M4806 Spinal stenosis, lumbar region: Secondary | ICD-10-CM | POA: Diagnosis not present

## 2014-12-06 DIAGNOSIS — M5416 Radiculopathy, lumbar region: Secondary | ICD-10-CM | POA: Diagnosis not present

## 2014-12-30 DIAGNOSIS — H8113 Benign paroxysmal vertigo, bilateral: Secondary | ICD-10-CM | POA: Diagnosis not present

## 2014-12-30 DIAGNOSIS — J449 Chronic obstructive pulmonary disease, unspecified: Secondary | ICD-10-CM | POA: Diagnosis not present

## 2014-12-30 DIAGNOSIS — G8929 Other chronic pain: Secondary | ICD-10-CM | POA: Diagnosis not present

## 2014-12-30 DIAGNOSIS — M545 Low back pain: Secondary | ICD-10-CM | POA: Diagnosis not present

## 2015-01-03 DIAGNOSIS — M545 Low back pain: Secondary | ICD-10-CM | POA: Diagnosis not present

## 2015-01-17 DIAGNOSIS — M5416 Radiculopathy, lumbar region: Secondary | ICD-10-CM | POA: Diagnosis not present

## 2015-01-17 DIAGNOSIS — M4806 Spinal stenosis, lumbar region: Secondary | ICD-10-CM | POA: Diagnosis not present

## 2015-01-25 DIAGNOSIS — L820 Inflamed seborrheic keratosis: Secondary | ICD-10-CM | POA: Diagnosis not present

## 2015-01-27 DIAGNOSIS — K219 Gastro-esophageal reflux disease without esophagitis: Secondary | ICD-10-CM | POA: Diagnosis not present

## 2015-01-27 DIAGNOSIS — A6009 Herpesviral infection of other urogenital tract: Secondary | ICD-10-CM | POA: Diagnosis not present

## 2015-01-27 DIAGNOSIS — Z209 Contact with and (suspected) exposure to unspecified communicable disease: Secondary | ICD-10-CM | POA: Diagnosis not present

## 2015-01-28 DIAGNOSIS — R112 Nausea with vomiting, unspecified: Secondary | ICD-10-CM | POA: Diagnosis not present

## 2015-01-28 DIAGNOSIS — R079 Chest pain, unspecified: Secondary | ICD-10-CM | POA: Diagnosis not present

## 2015-01-28 DIAGNOSIS — E860 Dehydration: Secondary | ICD-10-CM | POA: Diagnosis not present

## 2015-02-04 DIAGNOSIS — Z209 Contact with and (suspected) exposure to unspecified communicable disease: Secondary | ICD-10-CM | POA: Diagnosis not present

## 2015-02-04 DIAGNOSIS — R220 Localized swelling, mass and lump, head: Secondary | ICD-10-CM | POA: Diagnosis not present

## 2015-02-04 DIAGNOSIS — Z6823 Body mass index (BMI) 23.0-23.9, adult: Secondary | ICD-10-CM | POA: Diagnosis not present

## 2015-02-14 DIAGNOSIS — J309 Allergic rhinitis, unspecified: Secondary | ICD-10-CM | POA: Diagnosis not present

## 2015-02-17 DIAGNOSIS — J209 Acute bronchitis, unspecified: Secondary | ICD-10-CM | POA: Diagnosis not present

## 2015-02-26 DIAGNOSIS — Z6823 Body mass index (BMI) 23.0-23.9, adult: Secondary | ICD-10-CM | POA: Diagnosis not present

## 2015-02-26 DIAGNOSIS — K219 Gastro-esophageal reflux disease without esophagitis: Secondary | ICD-10-CM | POA: Diagnosis not present

## 2015-02-26 DIAGNOSIS — A6000 Herpesviral infection of urogenital system, unspecified: Secondary | ICD-10-CM | POA: Diagnosis not present

## 2015-02-26 DIAGNOSIS — B373 Candidiasis of vulva and vagina: Secondary | ICD-10-CM | POA: Diagnosis not present

## 2015-03-12 DIAGNOSIS — J0100 Acute maxillary sinusitis, unspecified: Secondary | ICD-10-CM | POA: Diagnosis not present

## 2015-03-18 DIAGNOSIS — J0100 Acute maxillary sinusitis, unspecified: Secondary | ICD-10-CM | POA: Diagnosis not present

## 2015-03-27 DIAGNOSIS — M9903 Segmental and somatic dysfunction of lumbar region: Secondary | ICD-10-CM | POA: Diagnosis not present

## 2015-03-27 DIAGNOSIS — M5135 Other intervertebral disc degeneration, thoracolumbar region: Secondary | ICD-10-CM | POA: Diagnosis not present

## 2015-03-27 DIAGNOSIS — M5030 Other cervical disc degeneration, unspecified cervical region: Secondary | ICD-10-CM | POA: Diagnosis not present

## 2015-03-27 DIAGNOSIS — M9901 Segmental and somatic dysfunction of cervical region: Secondary | ICD-10-CM | POA: Diagnosis not present

## 2015-03-27 DIAGNOSIS — M9902 Segmental and somatic dysfunction of thoracic region: Secondary | ICD-10-CM | POA: Diagnosis not present

## 2015-05-02 DIAGNOSIS — M5136 Other intervertebral disc degeneration, lumbar region: Secondary | ICD-10-CM | POA: Diagnosis not present

## 2015-05-02 DIAGNOSIS — M5416 Radiculopathy, lumbar region: Secondary | ICD-10-CM | POA: Diagnosis not present

## 2015-05-19 ENCOUNTER — Encounter: Payer: Self-pay | Admitting: Podiatry

## 2015-05-19 NOTE — Progress Notes (Signed)
Subjective:     Patient ID: Alicia Arnold, female   DOB: 01-Feb-1946, 69 y.o.   MRN: [id number]  HPI  Review of Systems     Objective:   Physical Exam     Assessment:       Plan:           This encounter was created in error - please disregard.

## 2015-05-27 DIAGNOSIS — Z1389 Encounter for screening for other disorder: Secondary | ICD-10-CM | POA: Diagnosis not present

## 2015-05-27 DIAGNOSIS — Z0000 Encounter for general adult medical examination without abnormal findings: Secondary | ICD-10-CM | POA: Diagnosis not present

## 2015-05-27 DIAGNOSIS — Z139 Encounter for screening, unspecified: Secondary | ICD-10-CM | POA: Diagnosis not present

## 2015-05-28 DIAGNOSIS — B373 Candidiasis of vulva and vagina: Secondary | ICD-10-CM | POA: Diagnosis not present

## 2015-05-28 DIAGNOSIS — N760 Acute vaginitis: Secondary | ICD-10-CM | POA: Diagnosis not present

## 2015-06-20 DIAGNOSIS — R51 Headache: Secondary | ICD-10-CM | POA: Diagnosis not present

## 2015-07-01 DIAGNOSIS — Z6830 Body mass index (BMI) 30.0-30.9, adult: Secondary | ICD-10-CM | POA: Diagnosis not present

## 2015-07-01 DIAGNOSIS — R42 Dizziness and giddiness: Secondary | ICD-10-CM | POA: Diagnosis not present

## 2015-07-15 DIAGNOSIS — F419 Anxiety disorder, unspecified: Secondary | ICD-10-CM | POA: Diagnosis not present

## 2015-07-15 DIAGNOSIS — M9901 Segmental and somatic dysfunction of cervical region: Secondary | ICD-10-CM | POA: Diagnosis not present

## 2015-07-15 DIAGNOSIS — M9903 Segmental and somatic dysfunction of lumbar region: Secondary | ICD-10-CM | POA: Diagnosis not present

## 2015-07-15 DIAGNOSIS — M5030 Other cervical disc degeneration, unspecified cervical region: Secondary | ICD-10-CM | POA: Diagnosis not present

## 2015-07-15 DIAGNOSIS — G4700 Insomnia, unspecified: Secondary | ICD-10-CM | POA: Diagnosis not present

## 2015-07-15 DIAGNOSIS — M9902 Segmental and somatic dysfunction of thoracic region: Secondary | ICD-10-CM | POA: Diagnosis not present

## 2015-07-15 DIAGNOSIS — M5135 Other intervertebral disc degeneration, thoracolumbar region: Secondary | ICD-10-CM | POA: Diagnosis not present

## 2015-07-17 DIAGNOSIS — M9903 Segmental and somatic dysfunction of lumbar region: Secondary | ICD-10-CM | POA: Diagnosis not present

## 2015-07-17 DIAGNOSIS — M5135 Other intervertebral disc degeneration, thoracolumbar region: Secondary | ICD-10-CM | POA: Diagnosis not present

## 2015-07-17 DIAGNOSIS — M9901 Segmental and somatic dysfunction of cervical region: Secondary | ICD-10-CM | POA: Diagnosis not present

## 2015-07-17 DIAGNOSIS — M5030 Other cervical disc degeneration, unspecified cervical region: Secondary | ICD-10-CM | POA: Diagnosis not present

## 2015-07-17 DIAGNOSIS — M9902 Segmental and somatic dysfunction of thoracic region: Secondary | ICD-10-CM | POA: Diagnosis not present

## 2015-07-22 DIAGNOSIS — M5030 Other cervical disc degeneration, unspecified cervical region: Secondary | ICD-10-CM | POA: Diagnosis not present

## 2015-07-22 DIAGNOSIS — M5416 Radiculopathy, lumbar region: Secondary | ICD-10-CM | POA: Diagnosis not present

## 2015-07-22 DIAGNOSIS — M9901 Segmental and somatic dysfunction of cervical region: Secondary | ICD-10-CM | POA: Diagnosis not present

## 2015-07-22 DIAGNOSIS — M9902 Segmental and somatic dysfunction of thoracic region: Secondary | ICD-10-CM | POA: Diagnosis not present

## 2015-07-22 DIAGNOSIS — M9903 Segmental and somatic dysfunction of lumbar region: Secondary | ICD-10-CM | POA: Diagnosis not present

## 2015-07-22 DIAGNOSIS — M5135 Other intervertebral disc degeneration, thoracolumbar region: Secondary | ICD-10-CM | POA: Diagnosis not present

## 2015-07-22 DIAGNOSIS — M4806 Spinal stenosis, lumbar region: Secondary | ICD-10-CM | POA: Diagnosis not present

## 2015-07-31 DIAGNOSIS — Z23 Encounter for immunization: Secondary | ICD-10-CM | POA: Diagnosis not present

## 2015-08-14 DIAGNOSIS — G43119 Migraine with aura, intractable, without status migrainosus: Secondary | ICD-10-CM | POA: Diagnosis not present

## 2015-09-04 DIAGNOSIS — M5030 Other cervical disc degeneration, unspecified cervical region: Secondary | ICD-10-CM | POA: Diagnosis not present

## 2015-09-04 DIAGNOSIS — M9902 Segmental and somatic dysfunction of thoracic region: Secondary | ICD-10-CM | POA: Diagnosis not present

## 2015-09-04 DIAGNOSIS — M9901 Segmental and somatic dysfunction of cervical region: Secondary | ICD-10-CM | POA: Diagnosis not present

## 2015-09-04 DIAGNOSIS — M5135 Other intervertebral disc degeneration, thoracolumbar region: Secondary | ICD-10-CM | POA: Diagnosis not present

## 2015-09-04 DIAGNOSIS — J449 Chronic obstructive pulmonary disease, unspecified: Secondary | ICD-10-CM | POA: Diagnosis not present

## 2015-09-04 DIAGNOSIS — M9903 Segmental and somatic dysfunction of lumbar region: Secondary | ICD-10-CM | POA: Diagnosis not present

## 2015-09-16 DIAGNOSIS — M9902 Segmental and somatic dysfunction of thoracic region: Secondary | ICD-10-CM | POA: Diagnosis not present

## 2015-09-16 DIAGNOSIS — M5030 Other cervical disc degeneration, unspecified cervical region: Secondary | ICD-10-CM | POA: Diagnosis not present

## 2015-09-16 DIAGNOSIS — M5135 Other intervertebral disc degeneration, thoracolumbar region: Secondary | ICD-10-CM | POA: Diagnosis not present

## 2015-09-16 DIAGNOSIS — M9901 Segmental and somatic dysfunction of cervical region: Secondary | ICD-10-CM | POA: Diagnosis not present

## 2015-09-16 DIAGNOSIS — M9903 Segmental and somatic dysfunction of lumbar region: Secondary | ICD-10-CM | POA: Diagnosis not present

## 2015-09-24 DIAGNOSIS — N182 Chronic kidney disease, stage 2 (mild): Secondary | ICD-10-CM | POA: Diagnosis not present

## 2015-09-24 DIAGNOSIS — E785 Hyperlipidemia, unspecified: Secondary | ICD-10-CM | POA: Diagnosis not present

## 2015-09-24 DIAGNOSIS — I129 Hypertensive chronic kidney disease with stage 1 through stage 4 chronic kidney disease, or unspecified chronic kidney disease: Secondary | ICD-10-CM | POA: Diagnosis not present

## 2015-09-30 DIAGNOSIS — I129 Hypertensive chronic kidney disease with stage 1 through stage 4 chronic kidney disease, or unspecified chronic kidney disease: Secondary | ICD-10-CM | POA: Diagnosis not present

## 2015-09-30 DIAGNOSIS — E785 Hyperlipidemia, unspecified: Secondary | ICD-10-CM | POA: Diagnosis not present

## 2015-09-30 DIAGNOSIS — L578 Other skin changes due to chronic exposure to nonionizing radiation: Secondary | ICD-10-CM | POA: Diagnosis not present

## 2015-09-30 DIAGNOSIS — L814 Other melanin hyperpigmentation: Secondary | ICD-10-CM | POA: Diagnosis not present

## 2015-09-30 DIAGNOSIS — N182 Chronic kidney disease, stage 2 (mild): Secondary | ICD-10-CM | POA: Diagnosis not present

## 2015-09-30 DIAGNOSIS — M4806 Spinal stenosis, lumbar region: Secondary | ICD-10-CM | POA: Diagnosis not present

## 2015-10-30 DIAGNOSIS — L820 Inflamed seborrheic keratosis: Secondary | ICD-10-CM | POA: Diagnosis not present

## 2015-11-17 DIAGNOSIS — R531 Weakness: Secondary | ICD-10-CM | POA: Diagnosis not present

## 2015-12-08 DIAGNOSIS — D485 Neoplasm of uncertain behavior of skin: Secondary | ICD-10-CM | POA: Diagnosis not present

## 2015-12-08 DIAGNOSIS — D045 Carcinoma in situ of skin of trunk: Secondary | ICD-10-CM | POA: Diagnosis not present

## 2015-12-15 DIAGNOSIS — N814 Uterovaginal prolapse, unspecified: Secondary | ICD-10-CM | POA: Diagnosis not present

## 2015-12-15 DIAGNOSIS — Z1231 Encounter for screening mammogram for malignant neoplasm of breast: Secondary | ICD-10-CM | POA: Diagnosis not present

## 2016-01-07 DIAGNOSIS — N182 Chronic kidney disease, stage 2 (mild): Secondary | ICD-10-CM | POA: Diagnosis not present

## 2016-01-07 DIAGNOSIS — E785 Hyperlipidemia, unspecified: Secondary | ICD-10-CM | POA: Diagnosis not present

## 2016-01-07 DIAGNOSIS — I129 Hypertensive chronic kidney disease with stage 1 through stage 4 chronic kidney disease, or unspecified chronic kidney disease: Secondary | ICD-10-CM | POA: Diagnosis not present

## 2016-01-13 DIAGNOSIS — E785 Hyperlipidemia, unspecified: Secondary | ICD-10-CM | POA: Diagnosis not present

## 2016-01-13 DIAGNOSIS — I129 Hypertensive chronic kidney disease with stage 1 through stage 4 chronic kidney disease, or unspecified chronic kidney disease: Secondary | ICD-10-CM | POA: Diagnosis not present

## 2016-01-13 DIAGNOSIS — N182 Chronic kidney disease, stage 2 (mild): Secondary | ICD-10-CM | POA: Diagnosis not present

## 2016-01-13 DIAGNOSIS — A6000 Herpesviral infection of urogenital system, unspecified: Secondary | ICD-10-CM | POA: Diagnosis not present

## 2016-02-12 DIAGNOSIS — J029 Acute pharyngitis, unspecified: Secondary | ICD-10-CM | POA: Diagnosis not present

## 2016-02-12 DIAGNOSIS — Z7189 Other specified counseling: Secondary | ICD-10-CM | POA: Diagnosis not present

## 2016-02-12 DIAGNOSIS — Z6823 Body mass index (BMI) 23.0-23.9, adult: Secondary | ICD-10-CM | POA: Diagnosis not present

## 2016-02-12 DIAGNOSIS — J309 Allergic rhinitis, unspecified: Secondary | ICD-10-CM | POA: Diagnosis not present

## 2016-02-17 DIAGNOSIS — L820 Inflamed seborrheic keratosis: Secondary | ICD-10-CM | POA: Diagnosis not present

## 2016-02-17 DIAGNOSIS — L853 Xerosis cutis: Secondary | ICD-10-CM | POA: Diagnosis not present

## 2016-02-24 DIAGNOSIS — G44209 Tension-type headache, unspecified, not intractable: Secondary | ICD-10-CM | POA: Diagnosis not present

## 2016-02-24 DIAGNOSIS — Z6823 Body mass index (BMI) 23.0-23.9, adult: Secondary | ICD-10-CM | POA: Diagnosis not present

## 2016-02-24 DIAGNOSIS — G43909 Migraine, unspecified, not intractable, without status migrainosus: Secondary | ICD-10-CM | POA: Diagnosis not present

## 2016-02-27 DIAGNOSIS — R5381 Other malaise: Secondary | ICD-10-CM | POA: Diagnosis not present

## 2016-02-27 DIAGNOSIS — J0100 Acute maxillary sinusitis, unspecified: Secondary | ICD-10-CM | POA: Diagnosis not present

## 2016-03-01 DIAGNOSIS — R5381 Other malaise: Secondary | ICD-10-CM | POA: Diagnosis not present

## 2016-03-01 DIAGNOSIS — J0100 Acute maxillary sinusitis, unspecified: Secondary | ICD-10-CM | POA: Diagnosis not present

## 2016-04-01 DIAGNOSIS — Z8711 Personal history of peptic ulcer disease: Secondary | ICD-10-CM | POA: Diagnosis not present

## 2016-04-01 DIAGNOSIS — R1013 Epigastric pain: Secondary | ICD-10-CM | POA: Diagnosis not present

## 2016-04-07 DIAGNOSIS — H04123 Dry eye syndrome of bilateral lacrimal glands: Secondary | ICD-10-CM | POA: Diagnosis not present

## 2016-04-22 DIAGNOSIS — N182 Chronic kidney disease, stage 2 (mild): Secondary | ICD-10-CM | POA: Diagnosis not present

## 2016-04-22 DIAGNOSIS — I129 Hypertensive chronic kidney disease with stage 1 through stage 4 chronic kidney disease, or unspecified chronic kidney disease: Secondary | ICD-10-CM | POA: Diagnosis not present

## 2016-04-22 DIAGNOSIS — E785 Hyperlipidemia, unspecified: Secondary | ICD-10-CM | POA: Diagnosis not present

## 2016-04-26 DIAGNOSIS — D3101 Benign neoplasm of right conjunctiva: Secondary | ICD-10-CM | POA: Diagnosis not present

## 2016-04-26 DIAGNOSIS — H04123 Dry eye syndrome of bilateral lacrimal glands: Secondary | ICD-10-CM | POA: Diagnosis not present

## 2016-04-26 DIAGNOSIS — L929 Granulomatous disorder of the skin and subcutaneous tissue, unspecified: Secondary | ICD-10-CM | POA: Diagnosis not present

## 2016-04-29 DIAGNOSIS — Z6823 Body mass index (BMI) 23.0-23.9, adult: Secondary | ICD-10-CM | POA: Diagnosis not present

## 2016-04-29 DIAGNOSIS — I129 Hypertensive chronic kidney disease with stage 1 through stage 4 chronic kidney disease, or unspecified chronic kidney disease: Secondary | ICD-10-CM | POA: Diagnosis not present

## 2016-04-29 DIAGNOSIS — E785 Hyperlipidemia, unspecified: Secondary | ICD-10-CM | POA: Diagnosis not present

## 2016-04-29 DIAGNOSIS — N182 Chronic kidney disease, stage 2 (mild): Secondary | ICD-10-CM | POA: Diagnosis not present

## 2016-05-03 DIAGNOSIS — H04123 Dry eye syndrome of bilateral lacrimal glands: Secondary | ICD-10-CM | POA: Diagnosis not present

## 2016-05-03 DIAGNOSIS — H43813 Vitreous degeneration, bilateral: Secondary | ICD-10-CM | POA: Diagnosis not present

## 2016-05-03 DIAGNOSIS — Z0100 Encounter for examination of eyes and vision without abnormal findings: Secondary | ICD-10-CM | POA: Diagnosis not present

## 2016-05-03 DIAGNOSIS — H16103 Unspecified superficial keratitis, bilateral: Secondary | ICD-10-CM | POA: Diagnosis not present

## 2016-05-13 DIAGNOSIS — L820 Inflamed seborrheic keratosis: Secondary | ICD-10-CM | POA: Diagnosis not present

## 2016-05-19 DIAGNOSIS — R51 Headache: Secondary | ICD-10-CM | POA: Diagnosis not present

## 2016-05-19 DIAGNOSIS — R110 Nausea: Secondary | ICD-10-CM | POA: Diagnosis not present

## 2016-06-03 DIAGNOSIS — M6702 Short Achilles tendon (acquired), left ankle: Secondary | ICD-10-CM | POA: Diagnosis not present

## 2016-06-03 DIAGNOSIS — M722 Plantar fascial fibromatosis: Secondary | ICD-10-CM | POA: Diagnosis not present

## 2016-06-07 DIAGNOSIS — H43813 Vitreous degeneration, bilateral: Secondary | ICD-10-CM | POA: Diagnosis not present

## 2016-06-07 DIAGNOSIS — Z961 Presence of intraocular lens: Secondary | ICD-10-CM | POA: Diagnosis not present

## 2016-06-07 DIAGNOSIS — H04123 Dry eye syndrome of bilateral lacrimal glands: Secondary | ICD-10-CM | POA: Diagnosis not present

## 2016-06-07 DIAGNOSIS — H16103 Unspecified superficial keratitis, bilateral: Secondary | ICD-10-CM | POA: Diagnosis not present

## 2016-07-19 DIAGNOSIS — Z23 Encounter for immunization: Secondary | ICD-10-CM | POA: Diagnosis not present

## 2016-07-26 DIAGNOSIS — J019 Acute sinusitis, unspecified: Secondary | ICD-10-CM | POA: Diagnosis not present

## 2016-07-26 DIAGNOSIS — J309 Allergic rhinitis, unspecified: Secondary | ICD-10-CM | POA: Diagnosis not present

## 2016-08-04 DIAGNOSIS — M545 Low back pain: Secondary | ICD-10-CM | POA: Diagnosis not present

## 2016-08-04 DIAGNOSIS — Z13820 Encounter for screening for osteoporosis: Secondary | ICD-10-CM | POA: Diagnosis not present

## 2016-08-04 DIAGNOSIS — Z6823 Body mass index (BMI) 23.0-23.9, adult: Secondary | ICD-10-CM | POA: Diagnosis not present

## 2016-08-04 DIAGNOSIS — G8929 Other chronic pain: Secondary | ICD-10-CM | POA: Diagnosis not present

## 2016-08-10 DIAGNOSIS — M545 Low back pain: Secondary | ICD-10-CM | POA: Diagnosis not present

## 2016-08-31 DIAGNOSIS — M7541 Impingement syndrome of right shoulder: Secondary | ICD-10-CM | POA: Diagnosis not present

## 2016-09-07 DIAGNOSIS — F339 Major depressive disorder, recurrent, unspecified: Secondary | ICD-10-CM | POA: Diagnosis not present

## 2016-09-07 DIAGNOSIS — I129 Hypertensive chronic kidney disease with stage 1 through stage 4 chronic kidney disease, or unspecified chronic kidney disease: Secondary | ICD-10-CM | POA: Diagnosis not present

## 2016-09-07 DIAGNOSIS — H6120 Impacted cerumen, unspecified ear: Secondary | ICD-10-CM | POA: Diagnosis not present

## 2016-09-07 DIAGNOSIS — N182 Chronic kidney disease, stage 2 (mild): Secondary | ICD-10-CM | POA: Diagnosis not present

## 2016-09-13 DIAGNOSIS — M48061 Spinal stenosis, lumbar region without neurogenic claudication: Secondary | ICD-10-CM | POA: Diagnosis not present

## 2016-09-13 DIAGNOSIS — M5417 Radiculopathy, lumbosacral region: Secondary | ICD-10-CM | POA: Diagnosis not present

## 2016-09-13 DIAGNOSIS — M5137 Other intervertebral disc degeneration, lumbosacral region: Secondary | ICD-10-CM | POA: Diagnosis not present

## 2016-10-14 DIAGNOSIS — H6121 Impacted cerumen, right ear: Secondary | ICD-10-CM | POA: Diagnosis not present

## 2016-10-14 DIAGNOSIS — Z6823 Body mass index (BMI) 23.0-23.9, adult: Secondary | ICD-10-CM | POA: Diagnosis not present

## 2016-10-14 DIAGNOSIS — J0100 Acute maxillary sinusitis, unspecified: Secondary | ICD-10-CM | POA: Diagnosis not present

## 2016-10-28 DIAGNOSIS — Z6823 Body mass index (BMI) 23.0-23.9, adult: Secondary | ICD-10-CM | POA: Diagnosis not present

## 2016-10-28 DIAGNOSIS — N952 Postmenopausal atrophic vaginitis: Secondary | ICD-10-CM | POA: Diagnosis not present

## 2016-10-28 DIAGNOSIS — N8110 Cystocele, unspecified: Secondary | ICD-10-CM | POA: Diagnosis not present

## 2016-11-02 DIAGNOSIS — J324 Chronic pansinusitis: Secondary | ICD-10-CM | POA: Diagnosis not present

## 2016-11-16 DIAGNOSIS — A6009 Herpesviral infection of other urogenital tract: Secondary | ICD-10-CM | POA: Diagnosis not present

## 2016-11-16 DIAGNOSIS — F339 Major depressive disorder, recurrent, unspecified: Secondary | ICD-10-CM | POA: Diagnosis not present

## 2016-11-16 DIAGNOSIS — G43909 Migraine, unspecified, not intractable, without status migrainosus: Secondary | ICD-10-CM | POA: Diagnosis not present

## 2016-11-16 DIAGNOSIS — B373 Candidiasis of vulva and vagina: Secondary | ICD-10-CM | POA: Diagnosis not present

## 2016-11-22 DIAGNOSIS — J0100 Acute maxillary sinusitis, unspecified: Secondary | ICD-10-CM | POA: Diagnosis not present

## 2016-11-25 DIAGNOSIS — R233 Spontaneous ecchymoses: Secondary | ICD-10-CM | POA: Diagnosis not present

## 2016-11-25 DIAGNOSIS — L853 Xerosis cutis: Secondary | ICD-10-CM | POA: Diagnosis not present

## 2016-11-25 DIAGNOSIS — L578 Other skin changes due to chronic exposure to nonionizing radiation: Secondary | ICD-10-CM | POA: Diagnosis not present

## 2016-11-25 DIAGNOSIS — L820 Inflamed seborrheic keratosis: Secondary | ICD-10-CM | POA: Diagnosis not present

## 2016-12-29 DIAGNOSIS — H1045 Other chronic allergic conjunctivitis: Secondary | ICD-10-CM | POA: Diagnosis not present

## 2016-12-29 DIAGNOSIS — H1033 Unspecified acute conjunctivitis, bilateral: Secondary | ICD-10-CM | POA: Diagnosis not present

## 2017-01-03 DIAGNOSIS — N952 Postmenopausal atrophic vaginitis: Secondary | ICD-10-CM | POA: Diagnosis not present

## 2017-01-03 DIAGNOSIS — N760 Acute vaginitis: Secondary | ICD-10-CM | POA: Diagnosis not present

## 2017-01-03 DIAGNOSIS — Z6823 Body mass index (BMI) 23.0-23.9, adult: Secondary | ICD-10-CM | POA: Diagnosis not present

## 2017-01-03 DIAGNOSIS — B9689 Other specified bacterial agents as the cause of diseases classified elsewhere: Secondary | ICD-10-CM | POA: Diagnosis not present

## 2017-01-13 DIAGNOSIS — M722 Plantar fascial fibromatosis: Secondary | ICD-10-CM | POA: Diagnosis not present

## 2017-01-13 DIAGNOSIS — M79674 Pain in right toe(s): Secondary | ICD-10-CM | POA: Diagnosis not present

## 2017-01-13 DIAGNOSIS — M2011 Hallux valgus (acquired), right foot: Secondary | ICD-10-CM | POA: Diagnosis not present

## 2017-01-18 DIAGNOSIS — Z1231 Encounter for screening mammogram for malignant neoplasm of breast: Secondary | ICD-10-CM | POA: Diagnosis not present

## 2017-01-20 DIAGNOSIS — N760 Acute vaginitis: Secondary | ICD-10-CM | POA: Diagnosis not present

## 2017-01-20 DIAGNOSIS — J449 Chronic obstructive pulmonary disease, unspecified: Secondary | ICD-10-CM | POA: Diagnosis not present

## 2017-01-20 DIAGNOSIS — Z6823 Body mass index (BMI) 23.0-23.9, adult: Secondary | ICD-10-CM | POA: Diagnosis not present

## 2017-01-20 DIAGNOSIS — B9689 Other specified bacterial agents as the cause of diseases classified elsewhere: Secondary | ICD-10-CM | POA: Diagnosis not present

## 2017-01-26 DIAGNOSIS — L259 Unspecified contact dermatitis, unspecified cause: Secondary | ICD-10-CM | POA: Diagnosis not present

## 2017-01-26 DIAGNOSIS — Z6823 Body mass index (BMI) 23.0-23.9, adult: Secondary | ICD-10-CM | POA: Diagnosis not present

## 2017-02-01 DIAGNOSIS — Z139 Encounter for screening, unspecified: Secondary | ICD-10-CM | POA: Diagnosis not present

## 2017-02-01 DIAGNOSIS — Z0000 Encounter for general adult medical examination without abnormal findings: Secondary | ICD-10-CM | POA: Diagnosis not present

## 2017-02-01 DIAGNOSIS — Z7189 Other specified counseling: Secondary | ICD-10-CM | POA: Diagnosis not present

## 2017-02-16 DIAGNOSIS — E785 Hyperlipidemia, unspecified: Secondary | ICD-10-CM | POA: Diagnosis not present

## 2017-02-16 DIAGNOSIS — J449 Chronic obstructive pulmonary disease, unspecified: Secondary | ICD-10-CM | POA: Diagnosis not present

## 2017-02-23 DIAGNOSIS — Z6823 Body mass index (BMI) 23.0-23.9, adult: Secondary | ICD-10-CM | POA: Diagnosis not present

## 2017-02-23 DIAGNOSIS — N898 Other specified noninflammatory disorders of vagina: Secondary | ICD-10-CM | POA: Diagnosis not present

## 2017-02-23 DIAGNOSIS — Z01419 Encounter for gynecological examination (general) (routine) without abnormal findings: Secondary | ICD-10-CM | POA: Diagnosis not present

## 2017-03-02 DIAGNOSIS — Z131 Encounter for screening for diabetes mellitus: Secondary | ICD-10-CM | POA: Diagnosis not present

## 2017-03-02 DIAGNOSIS — E785 Hyperlipidemia, unspecified: Secondary | ICD-10-CM | POA: Diagnosis not present

## 2017-03-02 DIAGNOSIS — I129 Hypertensive chronic kidney disease with stage 1 through stage 4 chronic kidney disease, or unspecified chronic kidney disease: Secondary | ICD-10-CM | POA: Diagnosis not present

## 2017-03-03 DIAGNOSIS — Z139 Encounter for screening, unspecified: Secondary | ICD-10-CM | POA: Diagnosis not present

## 2017-03-03 DIAGNOSIS — B373 Candidiasis of vulva and vagina: Secondary | ICD-10-CM | POA: Diagnosis not present

## 2017-03-03 DIAGNOSIS — Z789 Other specified health status: Secondary | ICD-10-CM | POA: Diagnosis not present

## 2017-03-03 DIAGNOSIS — Z6823 Body mass index (BMI) 23.0-23.9, adult: Secondary | ICD-10-CM | POA: Diagnosis not present

## 2017-03-05 DIAGNOSIS — L570 Actinic keratosis: Secondary | ICD-10-CM | POA: Diagnosis not present

## 2017-03-05 DIAGNOSIS — L821 Other seborrheic keratosis: Secondary | ICD-10-CM | POA: Diagnosis not present

## 2017-03-17 DIAGNOSIS — E785 Hyperlipidemia, unspecified: Secondary | ICD-10-CM | POA: Diagnosis not present

## 2017-03-17 DIAGNOSIS — N182 Chronic kidney disease, stage 2 (mild): Secondary | ICD-10-CM | POA: Diagnosis not present

## 2017-03-17 DIAGNOSIS — I129 Hypertensive chronic kidney disease with stage 1 through stage 4 chronic kidney disease, or unspecified chronic kidney disease: Secondary | ICD-10-CM | POA: Diagnosis not present

## 2017-03-17 DIAGNOSIS — F339 Major depressive disorder, recurrent, unspecified: Secondary | ICD-10-CM | POA: Diagnosis not present

## 2017-03-18 DIAGNOSIS — Z789 Other specified health status: Secondary | ICD-10-CM | POA: Diagnosis not present

## 2017-03-18 DIAGNOSIS — I129 Hypertensive chronic kidney disease with stage 1 through stage 4 chronic kidney disease, or unspecified chronic kidney disease: Secondary | ICD-10-CM | POA: Diagnosis not present

## 2017-03-18 DIAGNOSIS — E785 Hyperlipidemia, unspecified: Secondary | ICD-10-CM | POA: Diagnosis not present

## 2017-03-18 DIAGNOSIS — N182 Chronic kidney disease, stage 2 (mild): Secondary | ICD-10-CM | POA: Diagnosis not present

## 2017-04-06 DIAGNOSIS — M85852 Other specified disorders of bone density and structure, left thigh: Secondary | ICD-10-CM | POA: Diagnosis not present

## 2017-04-06 DIAGNOSIS — M8580 Other specified disorders of bone density and structure, unspecified site: Secondary | ICD-10-CM | POA: Diagnosis not present

## 2017-04-06 DIAGNOSIS — Z780 Asymptomatic menopausal state: Secondary | ICD-10-CM | POA: Diagnosis not present

## 2017-04-13 DIAGNOSIS — Z139 Encounter for screening, unspecified: Secondary | ICD-10-CM | POA: Diagnosis not present

## 2017-04-13 DIAGNOSIS — Z6822 Body mass index (BMI) 22.0-22.9, adult: Secondary | ICD-10-CM | POA: Diagnosis not present

## 2017-04-13 DIAGNOSIS — Z1389 Encounter for screening for other disorder: Secondary | ICD-10-CM | POA: Diagnosis not present

## 2017-04-13 DIAGNOSIS — J22 Unspecified acute lower respiratory infection: Secondary | ICD-10-CM | POA: Diagnosis not present

## 2017-04-14 DIAGNOSIS — I249 Acute ischemic heart disease, unspecified: Secondary | ICD-10-CM | POA: Diagnosis not present

## 2017-04-14 DIAGNOSIS — M79609 Pain in unspecified limb: Secondary | ICD-10-CM | POA: Diagnosis not present

## 2017-04-14 DIAGNOSIS — I10 Essential (primary) hypertension: Secondary | ICD-10-CM | POA: Diagnosis not present

## 2017-04-14 DIAGNOSIS — F339 Major depressive disorder, recurrent, unspecified: Secondary | ICD-10-CM | POA: Diagnosis not present

## 2017-04-14 DIAGNOSIS — F3160 Bipolar disorder, current episode mixed, unspecified: Secondary | ICD-10-CM | POA: Diagnosis not present

## 2017-04-15 DIAGNOSIS — J0100 Acute maxillary sinusitis, unspecified: Secondary | ICD-10-CM | POA: Diagnosis not present

## 2017-04-19 DIAGNOSIS — L290 Pruritus ani: Secondary | ICD-10-CM | POA: Diagnosis not present

## 2017-05-04 DIAGNOSIS — F5109 Other insomnia not due to a substance or known physiological condition: Secondary | ICD-10-CM | POA: Diagnosis not present

## 2017-05-04 DIAGNOSIS — R030 Elevated blood-pressure reading, without diagnosis of hypertension: Secondary | ICD-10-CM | POA: Diagnosis not present

## 2017-05-26 DIAGNOSIS — L570 Actinic keratosis: Secondary | ICD-10-CM | POA: Diagnosis not present

## 2017-05-26 DIAGNOSIS — K625 Hemorrhage of anus and rectum: Secondary | ICD-10-CM | POA: Diagnosis not present

## 2017-07-06 DIAGNOSIS — E785 Hyperlipidemia, unspecified: Secondary | ICD-10-CM | POA: Diagnosis not present

## 2017-07-06 DIAGNOSIS — I129 Hypertensive chronic kidney disease with stage 1 through stage 4 chronic kidney disease, or unspecified chronic kidney disease: Secondary | ICD-10-CM | POA: Diagnosis not present

## 2017-07-15 DIAGNOSIS — E785 Hyperlipidemia, unspecified: Secondary | ICD-10-CM | POA: Diagnosis not present

## 2017-07-15 DIAGNOSIS — N182 Chronic kidney disease, stage 2 (mild): Secondary | ICD-10-CM | POA: Diagnosis not present

## 2017-07-15 DIAGNOSIS — I129 Hypertensive chronic kidney disease with stage 1 through stage 4 chronic kidney disease, or unspecified chronic kidney disease: Secondary | ICD-10-CM | POA: Diagnosis not present

## 2017-07-15 DIAGNOSIS — Z23 Encounter for immunization: Secondary | ICD-10-CM | POA: Diagnosis not present

## 2017-08-04 DIAGNOSIS — K644 Residual hemorrhoidal skin tags: Secondary | ICD-10-CM | POA: Diagnosis not present

## 2017-08-04 DIAGNOSIS — Z789 Other specified health status: Secondary | ICD-10-CM | POA: Diagnosis not present

## 2017-08-04 DIAGNOSIS — F325 Major depressive disorder, single episode, in full remission: Secondary | ICD-10-CM | POA: Diagnosis not present

## 2017-08-04 DIAGNOSIS — J449 Chronic obstructive pulmonary disease, unspecified: Secondary | ICD-10-CM | POA: Diagnosis not present

## 2017-08-16 DIAGNOSIS — J3089 Other allergic rhinitis: Secondary | ICD-10-CM | POA: Diagnosis not present

## 2017-08-17 DIAGNOSIS — Z9841 Cataract extraction status, right eye: Secondary | ICD-10-CM | POA: Diagnosis not present

## 2017-08-17 DIAGNOSIS — H5213 Myopia, bilateral: Secondary | ICD-10-CM | POA: Diagnosis not present

## 2017-08-17 DIAGNOSIS — J301 Allergic rhinitis due to pollen: Secondary | ICD-10-CM | POA: Diagnosis not present

## 2017-08-17 DIAGNOSIS — H47233 Glaucomatous optic atrophy, bilateral: Secondary | ICD-10-CM | POA: Diagnosis not present

## 2017-08-17 DIAGNOSIS — J3081 Allergic rhinitis due to animal (cat) (dog) hair and dander: Secondary | ICD-10-CM | POA: Diagnosis not present

## 2017-08-17 DIAGNOSIS — H40013 Open angle with borderline findings, low risk, bilateral: Secondary | ICD-10-CM | POA: Diagnosis not present

## 2017-08-17 DIAGNOSIS — Z9842 Cataract extraction status, left eye: Secondary | ICD-10-CM | POA: Diagnosis not present

## 2017-08-17 DIAGNOSIS — Z961 Presence of intraocular lens: Secondary | ICD-10-CM | POA: Diagnosis not present

## 2017-08-17 DIAGNOSIS — J3089 Other allergic rhinitis: Secondary | ICD-10-CM | POA: Diagnosis not present

## 2017-08-17 DIAGNOSIS — H524 Presbyopia: Secondary | ICD-10-CM | POA: Diagnosis not present

## 2017-08-17 DIAGNOSIS — H52221 Regular astigmatism, right eye: Secondary | ICD-10-CM | POA: Diagnosis not present

## 2017-08-19 DIAGNOSIS — J3081 Allergic rhinitis due to animal (cat) (dog) hair and dander: Secondary | ICD-10-CM | POA: Diagnosis not present

## 2017-08-19 DIAGNOSIS — J301 Allergic rhinitis due to pollen: Secondary | ICD-10-CM | POA: Diagnosis not present

## 2017-08-19 DIAGNOSIS — J3089 Other allergic rhinitis: Secondary | ICD-10-CM | POA: Diagnosis not present

## 2017-08-22 DIAGNOSIS — J3089 Other allergic rhinitis: Secondary | ICD-10-CM | POA: Diagnosis not present

## 2017-08-22 DIAGNOSIS — J301 Allergic rhinitis due to pollen: Secondary | ICD-10-CM | POA: Diagnosis not present

## 2017-08-22 DIAGNOSIS — J3081 Allergic rhinitis due to animal (cat) (dog) hair and dander: Secondary | ICD-10-CM | POA: Diagnosis not present

## 2017-08-23 DIAGNOSIS — J3089 Other allergic rhinitis: Secondary | ICD-10-CM | POA: Diagnosis not present

## 2017-08-23 DIAGNOSIS — J301 Allergic rhinitis due to pollen: Secondary | ICD-10-CM | POA: Diagnosis not present

## 2017-08-23 DIAGNOSIS — J3081 Allergic rhinitis due to animal (cat) (dog) hair and dander: Secondary | ICD-10-CM | POA: Diagnosis not present

## 2017-08-24 DIAGNOSIS — J3081 Allergic rhinitis due to animal (cat) (dog) hair and dander: Secondary | ICD-10-CM | POA: Diagnosis not present

## 2017-08-24 DIAGNOSIS — J301 Allergic rhinitis due to pollen: Secondary | ICD-10-CM | POA: Diagnosis not present

## 2017-08-24 DIAGNOSIS — J3089 Other allergic rhinitis: Secondary | ICD-10-CM | POA: Diagnosis not present

## 2017-08-25 DIAGNOSIS — J3089 Other allergic rhinitis: Secondary | ICD-10-CM | POA: Diagnosis not present

## 2017-08-25 DIAGNOSIS — J3081 Allergic rhinitis due to animal (cat) (dog) hair and dander: Secondary | ICD-10-CM | POA: Diagnosis not present

## 2017-08-25 DIAGNOSIS — J301 Allergic rhinitis due to pollen: Secondary | ICD-10-CM | POA: Diagnosis not present

## 2017-08-26 DIAGNOSIS — J3089 Other allergic rhinitis: Secondary | ICD-10-CM | POA: Diagnosis not present

## 2017-08-26 DIAGNOSIS — J3081 Allergic rhinitis due to animal (cat) (dog) hair and dander: Secondary | ICD-10-CM | POA: Diagnosis not present

## 2017-08-26 DIAGNOSIS — J301 Allergic rhinitis due to pollen: Secondary | ICD-10-CM | POA: Diagnosis not present

## 2017-10-06 DIAGNOSIS — R42 Dizziness and giddiness: Secondary | ICD-10-CM | POA: Diagnosis not present

## 2017-10-06 DIAGNOSIS — H6691 Otitis media, unspecified, right ear: Secondary | ICD-10-CM | POA: Diagnosis not present

## 2017-10-06 DIAGNOSIS — N814 Uterovaginal prolapse, unspecified: Secondary | ICD-10-CM | POA: Diagnosis not present

## 2017-10-06 DIAGNOSIS — E860 Dehydration: Secondary | ICD-10-CM | POA: Diagnosis not present

## 2017-10-19 DIAGNOSIS — J449 Chronic obstructive pulmonary disease, unspecified: Secondary | ICD-10-CM | POA: Diagnosis not present

## 2017-10-19 DIAGNOSIS — Z789 Other specified health status: Secondary | ICD-10-CM | POA: Diagnosis not present

## 2017-10-23 DIAGNOSIS — J309 Allergic rhinitis, unspecified: Secondary | ICD-10-CM | POA: Diagnosis not present

## 2017-11-10 DIAGNOSIS — E785 Hyperlipidemia, unspecified: Secondary | ICD-10-CM | POA: Diagnosis not present

## 2017-11-10 DIAGNOSIS — I129 Hypertensive chronic kidney disease with stage 1 through stage 4 chronic kidney disease, or unspecified chronic kidney disease: Secondary | ICD-10-CM | POA: Diagnosis not present

## 2017-11-22 DIAGNOSIS — N8111 Cystocele, midline: Secondary | ICD-10-CM | POA: Diagnosis not present

## 2017-11-22 DIAGNOSIS — Z01818 Encounter for other preprocedural examination: Secondary | ICD-10-CM | POA: Diagnosis not present

## 2017-11-22 DIAGNOSIS — M25571 Pain in right ankle and joints of right foot: Secondary | ICD-10-CM | POA: Diagnosis not present

## 2017-11-22 DIAGNOSIS — M659 Synovitis and tenosynovitis, unspecified: Secondary | ICD-10-CM | POA: Diagnosis not present

## 2017-11-24 DIAGNOSIS — L820 Inflamed seborrheic keratosis: Secondary | ICD-10-CM | POA: Diagnosis not present

## 2017-11-25 DIAGNOSIS — J449 Chronic obstructive pulmonary disease, unspecified: Secondary | ICD-10-CM | POA: Diagnosis not present

## 2017-11-25 DIAGNOSIS — I129 Hypertensive chronic kidney disease with stage 1 through stage 4 chronic kidney disease, or unspecified chronic kidney disease: Secondary | ICD-10-CM | POA: Diagnosis not present

## 2017-11-25 DIAGNOSIS — Z789 Other specified health status: Secondary | ICD-10-CM | POA: Diagnosis not present

## 2017-11-25 DIAGNOSIS — N182 Chronic kidney disease, stage 2 (mild): Secondary | ICD-10-CM | POA: Diagnosis not present

## 2017-11-29 DIAGNOSIS — Z79899 Other long term (current) drug therapy: Secondary | ICD-10-CM | POA: Diagnosis not present

## 2017-11-29 DIAGNOSIS — N880 Leukoplakia of cervix uteri: Secondary | ICD-10-CM | POA: Diagnosis not present

## 2017-11-29 DIAGNOSIS — N8111 Cystocele, midline: Secondary | ICD-10-CM | POA: Diagnosis not present

## 2017-11-29 DIAGNOSIS — N814 Uterovaginal prolapse, unspecified: Secondary | ICD-10-CM | POA: Diagnosis not present

## 2017-11-29 DIAGNOSIS — N8189 Other female genital prolapse: Secondary | ICD-10-CM | POA: Diagnosis not present

## 2017-11-29 DIAGNOSIS — F329 Major depressive disorder, single episode, unspecified: Secondary | ICD-10-CM | POA: Diagnosis not present

## 2017-11-30 DIAGNOSIS — Z79899 Other long term (current) drug therapy: Secondary | ICD-10-CM | POA: Diagnosis not present

## 2017-11-30 DIAGNOSIS — N8189 Other female genital prolapse: Secondary | ICD-10-CM | POA: Diagnosis not present

## 2017-11-30 DIAGNOSIS — N814 Uterovaginal prolapse, unspecified: Secondary | ICD-10-CM | POA: Diagnosis not present

## 2017-11-30 DIAGNOSIS — F329 Major depressive disorder, single episode, unspecified: Secondary | ICD-10-CM | POA: Diagnosis not present

## 2017-11-30 DIAGNOSIS — N880 Leukoplakia of cervix uteri: Secondary | ICD-10-CM | POA: Diagnosis not present

## 2017-12-01 DIAGNOSIS — Z79899 Other long term (current) drug therapy: Secondary | ICD-10-CM | POA: Diagnosis not present

## 2017-12-01 DIAGNOSIS — N880 Leukoplakia of cervix uteri: Secondary | ICD-10-CM | POA: Diagnosis not present

## 2017-12-01 DIAGNOSIS — F329 Major depressive disorder, single episode, unspecified: Secondary | ICD-10-CM | POA: Diagnosis not present

## 2017-12-01 DIAGNOSIS — N814 Uterovaginal prolapse, unspecified: Secondary | ICD-10-CM | POA: Diagnosis not present

## 2017-12-01 DIAGNOSIS — N8189 Other female genital prolapse: Secondary | ICD-10-CM | POA: Diagnosis not present

## 2017-12-22 DIAGNOSIS — M659 Synovitis and tenosynovitis, unspecified: Secondary | ICD-10-CM | POA: Diagnosis not present

## 2017-12-29 DIAGNOSIS — Z9889 Other specified postprocedural states: Secondary | ICD-10-CM | POA: Diagnosis not present

## 2018-01-19 DIAGNOSIS — Z1231 Encounter for screening mammogram for malignant neoplasm of breast: Secondary | ICD-10-CM | POA: Diagnosis not present

## 2018-01-27 DIAGNOSIS — T81509D Unspecified complication of foreign body accidentally left in body following unspecified procedure, subsequent encounter: Secondary | ICD-10-CM | POA: Diagnosis not present

## 2018-01-27 DIAGNOSIS — Z9889 Other specified postprocedural states: Secondary | ICD-10-CM | POA: Diagnosis not present

## 2018-02-07 DIAGNOSIS — Z139 Encounter for screening, unspecified: Secondary | ICD-10-CM | POA: Diagnosis not present

## 2018-02-07 DIAGNOSIS — F325 Major depressive disorder, single episode, in full remission: Secondary | ICD-10-CM | POA: Diagnosis not present

## 2018-02-07 DIAGNOSIS — Z0000 Encounter for general adult medical examination without abnormal findings: Secondary | ICD-10-CM | POA: Diagnosis not present

## 2018-04-04 DIAGNOSIS — E785 Hyperlipidemia, unspecified: Secondary | ICD-10-CM | POA: Diagnosis not present

## 2018-04-04 DIAGNOSIS — I129 Hypertensive chronic kidney disease with stage 1 through stage 4 chronic kidney disease, or unspecified chronic kidney disease: Secondary | ICD-10-CM | POA: Diagnosis not present

## 2018-04-13 DIAGNOSIS — E785 Hyperlipidemia, unspecified: Secondary | ICD-10-CM | POA: Diagnosis not present

## 2018-04-13 DIAGNOSIS — J309 Allergic rhinitis, unspecified: Secondary | ICD-10-CM | POA: Diagnosis not present

## 2018-04-13 DIAGNOSIS — I129 Hypertensive chronic kidney disease with stage 1 through stage 4 chronic kidney disease, or unspecified chronic kidney disease: Secondary | ICD-10-CM | POA: Diagnosis not present

## 2018-04-13 DIAGNOSIS — N182 Chronic kidney disease, stage 2 (mild): Secondary | ICD-10-CM | POA: Diagnosis not present

## 2018-04-16 DIAGNOSIS — I129 Hypertensive chronic kidney disease with stage 1 through stage 4 chronic kidney disease, or unspecified chronic kidney disease: Secondary | ICD-10-CM | POA: Diagnosis not present

## 2018-04-16 DIAGNOSIS — J449 Chronic obstructive pulmonary disease, unspecified: Secondary | ICD-10-CM | POA: Diagnosis not present

## 2018-04-16 DIAGNOSIS — N182 Chronic kidney disease, stage 2 (mild): Secondary | ICD-10-CM | POA: Diagnosis not present

## 2018-05-08 DIAGNOSIS — F325 Major depressive disorder, single episode, in full remission: Secondary | ICD-10-CM | POA: Diagnosis not present

## 2018-05-08 DIAGNOSIS — Z6821 Body mass index (BMI) 21.0-21.9, adult: Secondary | ICD-10-CM | POA: Diagnosis not present

## 2018-05-08 DIAGNOSIS — Z139 Encounter for screening, unspecified: Secondary | ICD-10-CM | POA: Diagnosis not present

## 2018-05-15 DIAGNOSIS — H43813 Vitreous degeneration, bilateral: Secondary | ICD-10-CM | POA: Diagnosis not present

## 2018-05-15 DIAGNOSIS — H16103 Unspecified superficial keratitis, bilateral: Secondary | ICD-10-CM | POA: Diagnosis not present

## 2018-05-15 DIAGNOSIS — H5213 Myopia, bilateral: Secondary | ICD-10-CM | POA: Diagnosis not present

## 2018-05-15 DIAGNOSIS — H04123 Dry eye syndrome of bilateral lacrimal glands: Secondary | ICD-10-CM | POA: Diagnosis not present

## 2018-06-16 DIAGNOSIS — J449 Chronic obstructive pulmonary disease, unspecified: Secondary | ICD-10-CM | POA: Diagnosis not present

## 2018-06-16 DIAGNOSIS — N183 Chronic kidney disease, stage 3 (moderate): Secondary | ICD-10-CM | POA: Diagnosis not present

## 2018-06-16 DIAGNOSIS — N182 Chronic kidney disease, stage 2 (mild): Secondary | ICD-10-CM | POA: Diagnosis not present

## 2018-06-16 DIAGNOSIS — I129 Hypertensive chronic kidney disease with stage 1 through stage 4 chronic kidney disease, or unspecified chronic kidney disease: Secondary | ICD-10-CM | POA: Diagnosis not present

## 2018-06-20 DIAGNOSIS — Z6822 Body mass index (BMI) 22.0-22.9, adult: Secondary | ICD-10-CM | POA: Diagnosis not present

## 2018-06-20 DIAGNOSIS — F325 Major depressive disorder, single episode, in full remission: Secondary | ICD-10-CM | POA: Diagnosis not present

## 2018-06-20 DIAGNOSIS — Z23 Encounter for immunization: Secondary | ICD-10-CM | POA: Diagnosis not present

## 2018-06-20 DIAGNOSIS — Z139 Encounter for screening, unspecified: Secondary | ICD-10-CM | POA: Diagnosis not present

## 2018-06-27 DIAGNOSIS — L820 Inflamed seborrheic keratosis: Secondary | ICD-10-CM | POA: Diagnosis not present

## 2018-06-27 DIAGNOSIS — L299 Pruritus, unspecified: Secondary | ICD-10-CM | POA: Diagnosis not present

## 2018-06-27 DIAGNOSIS — L300 Nummular dermatitis: Secondary | ICD-10-CM | POA: Diagnosis not present

## 2018-07-05 DIAGNOSIS — L300 Nummular dermatitis: Secondary | ICD-10-CM | POA: Diagnosis not present

## 2018-07-17 DIAGNOSIS — H04122 Dry eye syndrome of left lacrimal gland: Secondary | ICD-10-CM | POA: Diagnosis not present

## 2018-07-17 DIAGNOSIS — N183 Chronic kidney disease, stage 3 (moderate): Secondary | ICD-10-CM | POA: Diagnosis not present

## 2018-07-17 DIAGNOSIS — J449 Chronic obstructive pulmonary disease, unspecified: Secondary | ICD-10-CM | POA: Diagnosis not present

## 2018-07-17 DIAGNOSIS — I129 Hypertensive chronic kidney disease with stage 1 through stage 4 chronic kidney disease, or unspecified chronic kidney disease: Secondary | ICD-10-CM | POA: Diagnosis not present

## 2018-07-17 DIAGNOSIS — H04121 Dry eye syndrome of right lacrimal gland: Secondary | ICD-10-CM | POA: Diagnosis not present

## 2018-07-17 DIAGNOSIS — N182 Chronic kidney disease, stage 2 (mild): Secondary | ICD-10-CM | POA: Diagnosis not present

## 2018-07-25 DIAGNOSIS — N390 Urinary tract infection, site not specified: Secondary | ICD-10-CM | POA: Diagnosis not present

## 2018-07-25 DIAGNOSIS — Z6822 Body mass index (BMI) 22.0-22.9, adult: Secondary | ICD-10-CM | POA: Diagnosis not present

## 2018-08-17 DIAGNOSIS — J449 Chronic obstructive pulmonary disease, unspecified: Secondary | ICD-10-CM | POA: Diagnosis not present

## 2018-08-17 DIAGNOSIS — I129 Hypertensive chronic kidney disease with stage 1 through stage 4 chronic kidney disease, or unspecified chronic kidney disease: Secondary | ICD-10-CM | POA: Diagnosis not present

## 2018-08-17 DIAGNOSIS — N183 Chronic kidney disease, stage 3 (moderate): Secondary | ICD-10-CM | POA: Diagnosis not present

## 2018-09-05 DIAGNOSIS — L820 Inflamed seborrheic keratosis: Secondary | ICD-10-CM | POA: Diagnosis not present

## 2018-09-05 DIAGNOSIS — L570 Actinic keratosis: Secondary | ICD-10-CM | POA: Diagnosis not present

## 2018-09-07 DIAGNOSIS — N952 Postmenopausal atrophic vaginitis: Secondary | ICD-10-CM | POA: Diagnosis not present

## 2018-09-07 DIAGNOSIS — N362 Urethral caruncle: Secondary | ICD-10-CM | POA: Diagnosis not present

## 2018-09-07 DIAGNOSIS — Z139 Encounter for screening, unspecified: Secondary | ICD-10-CM | POA: Diagnosis not present

## 2018-09-07 DIAGNOSIS — R232 Flushing: Secondary | ICD-10-CM | POA: Diagnosis not present

## 2018-09-16 DIAGNOSIS — I129 Hypertensive chronic kidney disease with stage 1 through stage 4 chronic kidney disease, or unspecified chronic kidney disease: Secondary | ICD-10-CM | POA: Diagnosis not present

## 2018-09-16 DIAGNOSIS — J449 Chronic obstructive pulmonary disease, unspecified: Secondary | ICD-10-CM | POA: Diagnosis not present

## 2018-09-16 DIAGNOSIS — N183 Chronic kidney disease, stage 3 (moderate): Secondary | ICD-10-CM | POA: Diagnosis not present

## 2018-10-06 DIAGNOSIS — I129 Hypertensive chronic kidney disease with stage 1 through stage 4 chronic kidney disease, or unspecified chronic kidney disease: Secondary | ICD-10-CM | POA: Diagnosis not present

## 2018-10-06 DIAGNOSIS — E785 Hyperlipidemia, unspecified: Secondary | ICD-10-CM | POA: Diagnosis not present

## 2018-10-16 DIAGNOSIS — B373 Candidiasis of vulva and vagina: Secondary | ICD-10-CM | POA: Diagnosis not present

## 2018-10-16 DIAGNOSIS — I129 Hypertensive chronic kidney disease with stage 1 through stage 4 chronic kidney disease, or unspecified chronic kidney disease: Secondary | ICD-10-CM | POA: Diagnosis not present

## 2018-10-16 DIAGNOSIS — N182 Chronic kidney disease, stage 2 (mild): Secondary | ICD-10-CM | POA: Diagnosis not present

## 2018-10-16 DIAGNOSIS — Z139 Encounter for screening, unspecified: Secondary | ICD-10-CM | POA: Diagnosis not present

## 2018-10-16 DIAGNOSIS — E785 Hyperlipidemia, unspecified: Secondary | ICD-10-CM | POA: Diagnosis not present

## 2018-10-17 DIAGNOSIS — E785 Hyperlipidemia, unspecified: Secondary | ICD-10-CM | POA: Diagnosis not present

## 2018-10-17 DIAGNOSIS — I129 Hypertensive chronic kidney disease with stage 1 through stage 4 chronic kidney disease, or unspecified chronic kidney disease: Secondary | ICD-10-CM | POA: Diagnosis not present

## 2018-10-17 DIAGNOSIS — J449 Chronic obstructive pulmonary disease, unspecified: Secondary | ICD-10-CM | POA: Diagnosis not present

## 2018-10-17 DIAGNOSIS — N182 Chronic kidney disease, stage 2 (mild): Secondary | ICD-10-CM | POA: Diagnosis not present

## 2018-10-24 DIAGNOSIS — M5416 Radiculopathy, lumbar region: Secondary | ICD-10-CM | POA: Diagnosis not present

## 2018-10-27 DIAGNOSIS — M5416 Radiculopathy, lumbar region: Secondary | ICD-10-CM | POA: Diagnosis not present

## 2018-10-27 DIAGNOSIS — M4316 Spondylolisthesis, lumbar region: Secondary | ICD-10-CM | POA: Diagnosis not present

## 2018-11-17 DIAGNOSIS — J449 Chronic obstructive pulmonary disease, unspecified: Secondary | ICD-10-CM | POA: Diagnosis not present

## 2018-11-17 DIAGNOSIS — N182 Chronic kidney disease, stage 2 (mild): Secondary | ICD-10-CM | POA: Diagnosis not present

## 2018-11-17 DIAGNOSIS — I129 Hypertensive chronic kidney disease with stage 1 through stage 4 chronic kidney disease, or unspecified chronic kidney disease: Secondary | ICD-10-CM | POA: Diagnosis not present

## 2018-11-17 DIAGNOSIS — E785 Hyperlipidemia, unspecified: Secondary | ICD-10-CM | POA: Diagnosis not present

## 2018-12-04 DIAGNOSIS — Z139 Encounter for screening, unspecified: Secondary | ICD-10-CM | POA: Diagnosis not present

## 2018-12-04 DIAGNOSIS — J0100 Acute maxillary sinusitis, unspecified: Secondary | ICD-10-CM | POA: Diagnosis not present

## 2018-12-04 DIAGNOSIS — N183 Chronic kidney disease, stage 3 (moderate): Secondary | ICD-10-CM | POA: Diagnosis not present

## 2018-12-04 DIAGNOSIS — Z6822 Body mass index (BMI) 22.0-22.9, adult: Secondary | ICD-10-CM | POA: Diagnosis not present

## 2018-12-05 DIAGNOSIS — H16223 Keratoconjunctivitis sicca, not specified as Sjogren's, bilateral: Secondary | ICD-10-CM | POA: Diagnosis not present

## 2018-12-05 DIAGNOSIS — H2703 Aphakia, bilateral: Secondary | ICD-10-CM | POA: Diagnosis not present

## 2018-12-07 DIAGNOSIS — D225 Melanocytic nevi of trunk: Secondary | ICD-10-CM | POA: Diagnosis not present

## 2018-12-07 DIAGNOSIS — L820 Inflamed seborrheic keratosis: Secondary | ICD-10-CM | POA: Diagnosis not present

## 2018-12-07 DIAGNOSIS — C44519 Basal cell carcinoma of skin of other part of trunk: Secondary | ICD-10-CM | POA: Diagnosis not present

## 2018-12-15 DIAGNOSIS — J449 Chronic obstructive pulmonary disease, unspecified: Secondary | ICD-10-CM | POA: Diagnosis not present

## 2018-12-15 DIAGNOSIS — I129 Hypertensive chronic kidney disease with stage 1 through stage 4 chronic kidney disease, or unspecified chronic kidney disease: Secondary | ICD-10-CM | POA: Diagnosis not present

## 2018-12-15 DIAGNOSIS — N183 Chronic kidney disease, stage 3 (moderate): Secondary | ICD-10-CM | POA: Diagnosis not present

## 2018-12-15 DIAGNOSIS — N182 Chronic kidney disease, stage 2 (mild): Secondary | ICD-10-CM | POA: Diagnosis not present

## 2018-12-18 DIAGNOSIS — J320 Chronic maxillary sinusitis: Secondary | ICD-10-CM | POA: Diagnosis not present

## 2018-12-18 DIAGNOSIS — Z6822 Body mass index (BMI) 22.0-22.9, adult: Secondary | ICD-10-CM | POA: Diagnosis not present

## 2018-12-25 DIAGNOSIS — Z6822 Body mass index (BMI) 22.0-22.9, adult: Secondary | ICD-10-CM | POA: Diagnosis not present

## 2018-12-25 DIAGNOSIS — R220 Localized swelling, mass and lump, head: Secondary | ICD-10-CM | POA: Diagnosis not present

## 2018-12-25 DIAGNOSIS — H6120 Impacted cerumen, unspecified ear: Secondary | ICD-10-CM | POA: Diagnosis not present

## 2018-12-25 DIAGNOSIS — K047 Periapical abscess without sinus: Secondary | ICD-10-CM | POA: Diagnosis not present

## 2018-12-27 DIAGNOSIS — H6120 Impacted cerumen, unspecified ear: Secondary | ICD-10-CM | POA: Diagnosis not present

## 2018-12-27 DIAGNOSIS — Z6822 Body mass index (BMI) 22.0-22.9, adult: Secondary | ICD-10-CM | POA: Diagnosis not present

## 2018-12-27 DIAGNOSIS — H60509 Unspecified acute noninfective otitis externa, unspecified ear: Secondary | ICD-10-CM | POA: Diagnosis not present

## 2019-01-01 DIAGNOSIS — H6121 Impacted cerumen, right ear: Secondary | ICD-10-CM | POA: Diagnosis not present

## 2019-01-01 DIAGNOSIS — R1032 Left lower quadrant pain: Secondary | ICD-10-CM | POA: Diagnosis not present

## 2019-01-01 DIAGNOSIS — B369 Superficial mycosis, unspecified: Secondary | ICD-10-CM | POA: Diagnosis not present

## 2019-01-01 DIAGNOSIS — B373 Candidiasis of vulva and vagina: Secondary | ICD-10-CM | POA: Diagnosis not present

## 2019-01-01 DIAGNOSIS — H6240 Otitis externa in other diseases classified elsewhere, unspecified ear: Secondary | ICD-10-CM | POA: Diagnosis not present

## 2019-01-16 DIAGNOSIS — N183 Chronic kidney disease, stage 3 (moderate): Secondary | ICD-10-CM | POA: Diagnosis not present

## 2019-01-16 DIAGNOSIS — J449 Chronic obstructive pulmonary disease, unspecified: Secondary | ICD-10-CM | POA: Diagnosis not present

## 2019-01-16 DIAGNOSIS — E785 Hyperlipidemia, unspecified: Secondary | ICD-10-CM | POA: Diagnosis not present

## 2019-01-16 DIAGNOSIS — I129 Hypertensive chronic kidney disease with stage 1 through stage 4 chronic kidney disease, or unspecified chronic kidney disease: Secondary | ICD-10-CM | POA: Diagnosis not present

## 2019-02-08 DIAGNOSIS — L299 Pruritus, unspecified: Secondary | ICD-10-CM | POA: Diagnosis not present

## 2019-02-08 DIAGNOSIS — L300 Nummular dermatitis: Secondary | ICD-10-CM | POA: Diagnosis not present

## 2019-02-08 DIAGNOSIS — L820 Inflamed seborrheic keratosis: Secondary | ICD-10-CM | POA: Diagnosis not present

## 2019-02-15 DIAGNOSIS — J449 Chronic obstructive pulmonary disease, unspecified: Secondary | ICD-10-CM | POA: Diagnosis not present

## 2019-02-15 DIAGNOSIS — N183 Chronic kidney disease, stage 3 (moderate): Secondary | ICD-10-CM | POA: Diagnosis not present

## 2019-02-15 DIAGNOSIS — E785 Hyperlipidemia, unspecified: Secondary | ICD-10-CM | POA: Diagnosis not present

## 2019-02-15 DIAGNOSIS — I129 Hypertensive chronic kidney disease with stage 1 through stage 4 chronic kidney disease, or unspecified chronic kidney disease: Secondary | ICD-10-CM | POA: Diagnosis not present

## 2019-03-05 DIAGNOSIS — R1310 Dysphagia, unspecified: Secondary | ICD-10-CM | POA: Diagnosis not present

## 2019-03-09 DIAGNOSIS — I129 Hypertensive chronic kidney disease with stage 1 through stage 4 chronic kidney disease, or unspecified chronic kidney disease: Secondary | ICD-10-CM | POA: Diagnosis not present

## 2019-03-09 DIAGNOSIS — E785 Hyperlipidemia, unspecified: Secondary | ICD-10-CM | POA: Diagnosis not present

## 2019-03-16 DIAGNOSIS — E785 Hyperlipidemia, unspecified: Secondary | ICD-10-CM | POA: Diagnosis not present

## 2019-03-16 DIAGNOSIS — N183 Chronic kidney disease, stage 3 (moderate): Secondary | ICD-10-CM | POA: Diagnosis not present

## 2019-03-16 DIAGNOSIS — I129 Hypertensive chronic kidney disease with stage 1 through stage 4 chronic kidney disease, or unspecified chronic kidney disease: Secondary | ICD-10-CM | POA: Diagnosis not present

## 2019-03-16 DIAGNOSIS — J449 Chronic obstructive pulmonary disease, unspecified: Secondary | ICD-10-CM | POA: Diagnosis not present

## 2019-03-21 DIAGNOSIS — K5730 Diverticulosis of large intestine without perforation or abscess without bleeding: Secondary | ICD-10-CM | POA: Diagnosis not present

## 2019-03-21 DIAGNOSIS — Z1211 Encounter for screening for malignant neoplasm of colon: Secondary | ICD-10-CM | POA: Diagnosis not present

## 2019-03-21 DIAGNOSIS — R1310 Dysphagia, unspecified: Secondary | ICD-10-CM | POA: Diagnosis not present

## 2019-03-21 DIAGNOSIS — K219 Gastro-esophageal reflux disease without esophagitis: Secondary | ICD-10-CM | POA: Diagnosis not present

## 2019-04-02 DIAGNOSIS — Z1231 Encounter for screening mammogram for malignant neoplasm of breast: Secondary | ICD-10-CM | POA: Diagnosis not present

## 2019-04-10 DIAGNOSIS — M9905 Segmental and somatic dysfunction of pelvic region: Secondary | ICD-10-CM | POA: Diagnosis not present

## 2019-04-10 DIAGNOSIS — M62830 Muscle spasm of back: Secondary | ICD-10-CM | POA: Diagnosis not present

## 2019-04-10 DIAGNOSIS — M9901 Segmental and somatic dysfunction of cervical region: Secondary | ICD-10-CM | POA: Diagnosis not present

## 2019-04-10 DIAGNOSIS — M542 Cervicalgia: Secondary | ICD-10-CM | POA: Diagnosis not present

## 2019-04-10 DIAGNOSIS — M9903 Segmental and somatic dysfunction of lumbar region: Secondary | ICD-10-CM | POA: Diagnosis not present

## 2019-04-10 DIAGNOSIS — M9902 Segmental and somatic dysfunction of thoracic region: Secondary | ICD-10-CM | POA: Diagnosis not present

## 2019-04-10 DIAGNOSIS — M545 Low back pain: Secondary | ICD-10-CM | POA: Diagnosis not present

## 2019-04-12 DIAGNOSIS — Z0000 Encounter for general adult medical examination without abnormal findings: Secondary | ICD-10-CM | POA: Diagnosis not present

## 2019-04-12 DIAGNOSIS — E785 Hyperlipidemia, unspecified: Secondary | ICD-10-CM | POA: Diagnosis not present

## 2019-04-12 DIAGNOSIS — Z139 Encounter for screening, unspecified: Secondary | ICD-10-CM | POA: Diagnosis not present

## 2019-04-12 DIAGNOSIS — Z1331 Encounter for screening for depression: Secondary | ICD-10-CM | POA: Diagnosis not present

## 2019-04-12 DIAGNOSIS — J309 Allergic rhinitis, unspecified: Secondary | ICD-10-CM | POA: Diagnosis not present

## 2019-04-12 DIAGNOSIS — F325 Major depressive disorder, single episode, in full remission: Secondary | ICD-10-CM | POA: Diagnosis not present

## 2019-04-17 DIAGNOSIS — E785 Hyperlipidemia, unspecified: Secondary | ICD-10-CM | POA: Diagnosis not present

## 2019-04-17 DIAGNOSIS — J449 Chronic obstructive pulmonary disease, unspecified: Secondary | ICD-10-CM | POA: Diagnosis not present

## 2019-04-17 DIAGNOSIS — F325 Major depressive disorder, single episode, in full remission: Secondary | ICD-10-CM | POA: Diagnosis not present

## 2019-04-17 DIAGNOSIS — N183 Chronic kidney disease, stage 3 (moderate): Secondary | ICD-10-CM | POA: Diagnosis not present

## 2019-05-17 DIAGNOSIS — H43813 Vitreous degeneration, bilateral: Secondary | ICD-10-CM | POA: Diagnosis not present

## 2019-05-17 DIAGNOSIS — H04123 Dry eye syndrome of bilateral lacrimal glands: Secondary | ICD-10-CM | POA: Diagnosis not present

## 2019-05-17 DIAGNOSIS — H31002 Unspecified chorioretinal scars, left eye: Secondary | ICD-10-CM | POA: Diagnosis not present

## 2019-05-17 DIAGNOSIS — H5212 Myopia, left eye: Secondary | ICD-10-CM | POA: Diagnosis not present

## 2019-05-18 DIAGNOSIS — N183 Chronic kidney disease, stage 3 (moderate): Secondary | ICD-10-CM | POA: Diagnosis not present

## 2019-05-18 DIAGNOSIS — E785 Hyperlipidemia, unspecified: Secondary | ICD-10-CM | POA: Diagnosis not present

## 2019-05-18 DIAGNOSIS — F325 Major depressive disorder, single episode, in full remission: Secondary | ICD-10-CM | POA: Diagnosis not present

## 2019-05-18 DIAGNOSIS — J449 Chronic obstructive pulmonary disease, unspecified: Secondary | ICD-10-CM | POA: Diagnosis not present

## 2019-06-05 DIAGNOSIS — B079 Viral wart, unspecified: Secondary | ICD-10-CM | POA: Diagnosis not present

## 2019-06-05 DIAGNOSIS — L570 Actinic keratosis: Secondary | ICD-10-CM | POA: Diagnosis not present

## 2019-06-05 DIAGNOSIS — L821 Other seborrheic keratosis: Secondary | ICD-10-CM | POA: Diagnosis not present

## 2019-06-05 DIAGNOSIS — L814 Other melanin hyperpigmentation: Secondary | ICD-10-CM | POA: Diagnosis not present

## 2019-07-02 DIAGNOSIS — Z6823 Body mass index (BMI) 23.0-23.9, adult: Secondary | ICD-10-CM | POA: Diagnosis not present

## 2019-07-02 DIAGNOSIS — R202 Paresthesia of skin: Secondary | ICD-10-CM | POA: Diagnosis not present

## 2019-07-10 DIAGNOSIS — L84 Corns and callosities: Secondary | ICD-10-CM | POA: Diagnosis not present

## 2019-07-10 DIAGNOSIS — M7750 Other enthesopathy of unspecified foot: Secondary | ICD-10-CM | POA: Diagnosis not present

## 2019-07-23 ENCOUNTER — Encounter: Payer: Self-pay | Admitting: Neurology

## 2019-08-02 DIAGNOSIS — Z23 Encounter for immunization: Secondary | ICD-10-CM | POA: Diagnosis not present

## 2019-08-16 NOTE — Progress Notes (Signed)
[facility] Clinic Note - Initial Visit   Date: 08/17/19  Catelaya Choksi MRN: [id number] DOB: 1946/04/28   Dear Dr. Nyra Capes:  Thank you for your kind referral of Venesa Tollefsen for consultation of bilateral thigh pain. Although her history is well known to you, please allow [place] to reiterate it for the purpose of our medical record. The patient was accompanied to the clinic by self.    History of Present Illness: Swetha Magnone is a 73 y.o. right-handed female with GERD, hyperlipidemia, depression, lumbar spinal stenosis, stage III CKD presenting for evaluation of bilateral leg paresthesias.   Starting in September 2020, she developed burning sensation over the medial and anterior thigh which started in both legs and gradually improved in the right leg.  She continues to have burning over the left anterior thigh, which is intermittent occurring several times per hour.  She denies pain over the lateral thigh.  She has associated tenderness of the upper inner thigh region. She does not have any weakness, numbness, or tingling of the legs. There are no identifiable triggers such as standing, walking, or sitting.  She has tried topical ointment such as cortisone, and nothing provides relief.  She denies any falls or imbalance. No preceding injury.    She has history of lumbar spinal stenosis and back pain.  She sees Dr. Donivan Scull, orthopaedics, in [place] and has received ESI in the past.     Past Medical History:  Diagnosis Date  . COPD (chronic obstructive pulmonary disease) ([place])   . Depression   . Female bladder prolapse   . HTN (hypertension)   . Hyperlipidemia   . Spinal stenosis of lumbar region with radiculopathy   . Stage 3 chronic kidney disease     Past Surgical History:  Procedure Laterality Date  . carpal tunnel relase    . CERVICAL LAMINECTOMY       Medications:  Outpatient Encounter Medications as of 08/17/2019  Medication Sig  . ALPRAZolam (XANAX)  0.5 MG tablet TAKE ONE TABLET BY MOUTH 2 TIMES A DAY  . Cholecalciferol (VITAMIN D) 50 MCG (2000 UT) tablet Take by mouth.  . citalopram (CELEXA) 40 MG tablet TAKE 1 TABLET BY MOUTH ONCE DAILY  . cycloSPORINE (RESTASIS) 0.05 % ophthalmic emulsion USE 1 DROP IN BOTH EYES TWICE DAILY AS DIRECTED BY PHYSICIAN  . Magnesium Carbonate (MAGNESIUM GLUCONATE) 54mg /78ml syringe Take by mouth.  . meclizine (ANTIVERT) 25 MG tablet TAKE 1 TABLET BY MOUTH EVERY 8 HOURS AS NEEDED FOR DIZZINESS  . metoprolol succinate (TOPROL-XL) 25 MG 24 hr tablet Take by mouth.  . ondansetron (ZOFRAN) 4 MG tablet SMARTSIG:1-2.5 Tablet(s) By Mouth Every 8-12 Hours PRN  . pantoprazole (PROTONIX) 40 MG tablet Take by mouth.  . rosuvastatin (CRESTOR) 5 MG tablet TAKE 1 TABLET BY MOUTH ONCE DAILY  . valACYclovir (VALTREX) 500 MG tablet TAKE 1 CAPLET BY MOUTH ONCE DAILY   No facility-administered encounter medications on file as of 08/17/2019.     Allergies:  Allergies  Allergen Reactions  . Prednisone Itching and Rash  . Aspirin Other (See Comments)  . Codeine Nausea And Vomiting    Family History: Family History  Problem Relation Age of Onset  . CVA Father     Social History: Social History   Tobacco Use  . Smoking status: Never Smoker  . Smokeless tobacco: Never Used  Substance Use Topics  . Alcohol use: Never    Frequency: Never  . Drug use: Never   Social History  Social History Narrative   Right handed   One story home   No living will   One son    Review of Systems:  CONSTITUTIONAL: No fevers, chills, night sweats, or weight loss.   EYES: No visual changes or eye pain ENT: No hearing changes.  No history of nose bleeds.   RESPIRATORY: No cough, wheezing and shortness of breath.   CARDIOVASCULAR: Negative for chest pain, and palpitations.   GI: Negative for abdominal discomfort, blood in stools or black stools.  No recent change in bowel habits.   GU:  No history of incontinence.    MUSCLOSKELETAL: No history of joint pain or swelling.  +myalgias.   SKIN: Negative for lesions, rash, and itching.   HEMATOLOGY/ONCOLOGY: Negative for prolonged bleeding, bruising easily, and swollen nodes.  No history of cancer.   ENDOCRINE: Negative for cold or heat intolerance, polydipsia or goiter.   PSYCH:  No depression or anxiety symptoms.   NEURO: As Above.   Vital Signs:  BP 120/84   Pulse 74   Ht 5\' 6"  (1.676 m)   Wt 142 lb (64.4 kg)   SpO2 98%   BMI 22.92 kg/m    General Medical Exam:   General:  Well appearing, comfortable.   Eyes/ENT: see cranial nerve examination.   Neck:   No carotid bruits. Respiratory:  Clear to auscultation, good air entry bilaterally.   Cardiac:  Regular rate and rhythm, no murmur.   Extremities:  No deformities, edema, or skin discoloration.  She has pain with palpation over the left proximal medial and thigh.  Positive FABER on the left.  Negative SLR.  Skin:  No rashes or lesions.  Neurological Exam: MENTAL STATUS including orientation to time, place, person, recent and remote memory, attention span and concentration, language, and fund of knowledge is normal.  Speech is not dysarthric.  CRANIAL NERVES: II:  No visual field defects.   III-IV-VI: Pupils equal round and reactive to light.  Normal conjugate, extra-ocular eye movements in all directions of gaze.  No nystagmus.  No ptosis.   V:  Normal facial sensation.    VII:  Normal facial symmetry and movements.   VIII:  Normal hearing and vestibular function.   IX-X:  Normal palatal movement.   XI:  Normal shoulder shrug and head rotation.   XII:  Normal tongue strength and range of motion, no deviation or fasciculation.  MOTOR:  No atrophy, fasciculations or abnormal movements.  No pronator drift.   Upper Extremity:  Right  Left  Deltoid  5/5   5/5   Biceps  5/5   5/5   Triceps  5/5   5/5   Infraspinatus 5/5  5/5  Medial pectoralis 5/5  5/5  Wrist extensors  5/5   5/5   Wrist  flexors  5/5   5/5   Finger extensors  5/5   5/5   Finger flexors  5/5   5/5   Dorsal interossei  5/5   5/5   Abductor pollicis  5/5   5/5   Tone (Ashworth scale)  0  0   Lower Extremity:  Right  Left  Hip flexors  5/5   5/5   Hip extensors  5/5   5/5   Adductor 5/5  5/5  Abductor 5/5  5/5  Knee flexors  5/5   5/5   Knee extensors  5/5   5/5   Dorsiflexors  5/5   5/5   Plantarflexors  5/5  5/5   Toe extensors  5/5   5/5   Toe flexors  5/5   5/5   Tone (Ashworth scale)  0  0   MSRs:  Right        Left                  brachioradialis 2+  2+  biceps 2+  2+  triceps 2+  2+  patellar 2+  2+  ankle jerk 2+  2+  Hoffman no  no  plantar response down  down   SENSORY:  Normal and symmetric perception of light touch, pinprick, vibration, and proprioception.    COORDINATION/GAIT: Normal finger-to- nose-finger.  Intact rapid alternating movements bilaterally.  Able to rise from a chair without using arms.  Gait narrow based and stable.  Stressed gait intact, mild unsteadiness with tandem gait.   IMPRESSION: Left medial thigh pain is suggestive of primary musculoskeletal pathology (pain with palpation, positive FABER), moreso than nerve.  With her symptoms involving the medial thigh, it does not fit the distribution of lateral femoral cutaneous nerve, making meralgia paresthetica unlikely.  Further, doubt this is high L2-3 lumbosacral radiculopathy as there is no weakness and she specifically endorses soreness/burning with palpation over the proximal medial thigh, which is not a characteristic feature of radiculopathy.  She may have muscle strain/tendonitis (iliopsoas vs hip adductors) and I have asked her to follow-up with Sports Medicine at Dr. Towanda Octave office in [place].  No need for NCS/EMG due to low probability this is nerve-related.   Thank you for allowing me to participate in patient's care.  If I can answer any additional questions, I would be pleased to do so.    Sincerely,     Luella Gardenhire K. Posey Pronto, DO

## 2019-08-17 ENCOUNTER — Encounter: Payer: Self-pay | Admitting: Neurology

## 2019-08-17 ENCOUNTER — Ambulatory Visit (INDEPENDENT_AMBULATORY_CARE_PROVIDER_SITE_OTHER): Payer: Medicare Other | Admitting: Neurology

## 2019-08-17 ENCOUNTER — Other Ambulatory Visit: Payer: Self-pay

## 2019-08-17 VITALS — BP 120/84 | HR 74 | Ht 66.0 in | Wt 142.0 lb

## 2019-08-17 DIAGNOSIS — M79652 Pain in left thigh: Secondary | ICD-10-CM

## 2019-08-17 DIAGNOSIS — M79651 Pain in right thigh: Secondary | ICD-10-CM | POA: Diagnosis not present

## 2019-08-17 NOTE — Patient Instructions (Signed)
Please follow-up with Orthopaedics/Sport Medicine

## 2019-08-27 NOTE — Progress Notes (Signed)
Note sent to Ortho/sports faxed

## 2019-09-03 DIAGNOSIS — E785 Hyperlipidemia, unspecified: Secondary | ICD-10-CM | POA: Diagnosis not present

## 2019-09-03 DIAGNOSIS — I129 Hypertensive chronic kidney disease with stage 1 through stage 4 chronic kidney disease, or unspecified chronic kidney disease: Secondary | ICD-10-CM | POA: Diagnosis not present

## 2019-09-04 DIAGNOSIS — M5416 Radiculopathy, lumbar region: Secondary | ICD-10-CM | POA: Diagnosis not present

## 2019-09-07 DIAGNOSIS — M5416 Radiculopathy, lumbar region: Secondary | ICD-10-CM | POA: Diagnosis not present

## 2019-09-07 DIAGNOSIS — M5116 Intervertebral disc disorders with radiculopathy, lumbar region: Secondary | ICD-10-CM | POA: Diagnosis not present

## 2019-09-10 DIAGNOSIS — N182 Chronic kidney disease, stage 2 (mild): Secondary | ICD-10-CM | POA: Diagnosis not present

## 2019-09-10 DIAGNOSIS — I129 Hypertensive chronic kidney disease with stage 1 through stage 4 chronic kidney disease, or unspecified chronic kidney disease: Secondary | ICD-10-CM | POA: Diagnosis not present

## 2019-09-10 DIAGNOSIS — E785 Hyperlipidemia, unspecified: Secondary | ICD-10-CM | POA: Diagnosis not present

## 2019-09-10 DIAGNOSIS — J449 Chronic obstructive pulmonary disease, unspecified: Secondary | ICD-10-CM | POA: Diagnosis not present

## 2019-11-12 DIAGNOSIS — L820 Inflamed seborrheic keratosis: Secondary | ICD-10-CM | POA: Diagnosis not present

## 2019-11-12 DIAGNOSIS — L570 Actinic keratosis: Secondary | ICD-10-CM | POA: Diagnosis not present

## 2020-02-18 DIAGNOSIS — H40013 Open angle with borderline findings, low risk, bilateral: Secondary | ICD-10-CM | POA: Diagnosis not present

## 2020-02-18 DIAGNOSIS — H31002 Unspecified chorioretinal scars, left eye: Secondary | ICD-10-CM | POA: Diagnosis not present

## 2020-02-18 DIAGNOSIS — H5213 Myopia, bilateral: Secondary | ICD-10-CM | POA: Diagnosis not present

## 2020-02-18 DIAGNOSIS — H04123 Dry eye syndrome of bilateral lacrimal glands: Secondary | ICD-10-CM | POA: Diagnosis not present

## 2020-03-06 DIAGNOSIS — J449 Chronic obstructive pulmonary disease, unspecified: Secondary | ICD-10-CM | POA: Diagnosis not present

## 2020-03-06 DIAGNOSIS — Z6822 Body mass index (BMI) 22.0-22.9, adult: Secondary | ICD-10-CM | POA: Diagnosis not present

## 2020-03-06 DIAGNOSIS — F325 Major depressive disorder, single episode, in full remission: Secondary | ICD-10-CM | POA: Diagnosis not present

## 2020-03-06 DIAGNOSIS — H6121 Impacted cerumen, right ear: Secondary | ICD-10-CM | POA: Diagnosis not present

## 2020-03-11 DIAGNOSIS — E785 Hyperlipidemia, unspecified: Secondary | ICD-10-CM | POA: Diagnosis not present

## 2020-03-11 DIAGNOSIS — H6120 Impacted cerumen, unspecified ear: Secondary | ICD-10-CM | POA: Diagnosis not present

## 2020-03-20 DIAGNOSIS — I129 Hypertensive chronic kidney disease with stage 1 through stage 4 chronic kidney disease, or unspecified chronic kidney disease: Secondary | ICD-10-CM | POA: Diagnosis not present

## 2020-03-20 DIAGNOSIS — N182 Chronic kidney disease, stage 2 (mild): Secondary | ICD-10-CM | POA: Diagnosis not present

## 2020-03-20 DIAGNOSIS — Z6822 Body mass index (BMI) 22.0-22.9, adult: Secondary | ICD-10-CM | POA: Diagnosis not present

## 2020-03-20 DIAGNOSIS — E785 Hyperlipidemia, unspecified: Secondary | ICD-10-CM | POA: Diagnosis not present

## 2020-05-02 ENCOUNTER — Encounter: Payer: Self-pay | Admitting: Physical Medicine and Rehabilitation

## 2020-05-19 DIAGNOSIS — L578 Other skin changes due to chronic exposure to nonionizing radiation: Secondary | ICD-10-CM | POA: Diagnosis not present

## 2020-05-19 DIAGNOSIS — L814 Other melanin hyperpigmentation: Secondary | ICD-10-CM | POA: Diagnosis not present

## 2020-05-19 DIAGNOSIS — L820 Inflamed seborrheic keratosis: Secondary | ICD-10-CM | POA: Diagnosis not present

## 2020-06-02 DIAGNOSIS — H401122 Primary open-angle glaucoma, left eye, moderate stage: Secondary | ICD-10-CM | POA: Diagnosis not present

## 2020-06-02 DIAGNOSIS — Z961 Presence of intraocular lens: Secondary | ICD-10-CM | POA: Diagnosis not present

## 2020-06-02 DIAGNOSIS — H40011 Open angle with borderline findings, low risk, right eye: Secondary | ICD-10-CM | POA: Diagnosis not present

## 2020-06-02 DIAGNOSIS — H04123 Dry eye syndrome of bilateral lacrimal glands: Secondary | ICD-10-CM | POA: Diagnosis not present

## 2020-06-06 ENCOUNTER — Encounter: Payer: Medicare Other | Admitting: Physical Medicine and Rehabilitation

## 2020-06-20 ENCOUNTER — Encounter
Payer: Medicare Other | Attending: Physical Medicine and Rehabilitation | Admitting: Physical Medicine and Rehabilitation

## 2020-06-20 ENCOUNTER — Ambulatory Visit
Admission: RE | Admit: 2020-06-20 | Discharge: 2020-06-20 | Disposition: A | Discharge status: Home / Self Care | Payer: Medicare Other | Source: Ambulatory Visit | Attending: Physical Medicine and Rehabilitation | Admitting: Physical Medicine and Rehabilitation

## 2020-06-20 ENCOUNTER — Encounter: Payer: Self-pay | Admitting: Physical Medicine and Rehabilitation

## 2020-06-20 ENCOUNTER — Other Ambulatory Visit: Payer: Self-pay

## 2020-06-20 VITALS — BP 147/82 | HR 60 | Temp 98.2°F | Ht 65.0 in | Wt 141.2 lb

## 2020-06-20 DIAGNOSIS — G8929 Other chronic pain: Secondary | ICD-10-CM | POA: Diagnosis not present

## 2020-06-20 DIAGNOSIS — M5116 Intervertebral disc disorders with radiculopathy, lumbar region: Secondary | ICD-10-CM | POA: Diagnosis not present

## 2020-06-20 DIAGNOSIS — M5442 Lumbago with sciatica, left side: Secondary | ICD-10-CM

## 2020-06-20 DIAGNOSIS — M47816 Spondylosis without myelopathy or radiculopathy, lumbar region: Secondary | ICD-10-CM | POA: Diagnosis not present

## 2020-06-20 DIAGNOSIS — M533 Sacrococcygeal disorders, not elsewhere classified: Secondary | ICD-10-CM | POA: Diagnosis not present

## 2020-06-20 DIAGNOSIS — Z5181 Encounter for therapeutic drug level monitoring: Secondary | ICD-10-CM | POA: Diagnosis not present

## 2020-06-20 DIAGNOSIS — G894 Chronic pain syndrome: Secondary | ICD-10-CM | POA: Diagnosis not present

## 2020-06-20 DIAGNOSIS — Z79891 Long term (current) use of opiate analgesic: Secondary | ICD-10-CM | POA: Diagnosis not present

## 2020-06-20 DIAGNOSIS — M4316 Spondylolisthesis, lumbar region: Secondary | ICD-10-CM | POA: Diagnosis not present

## 2020-06-20 MED ORDER — DULOXETINE HCL 30 MG PO CPEP: 30.0000 mg | ORAL_CAPSULE | Freq: Every day | ORAL | 3 refills | Status: DC

## 2020-06-20 MED ORDER — ONDANSETRON HCL 4 MG PO TABS: 4.0000 mg | ORAL_TABLET | Freq: Three times a day (TID) | ORAL | 1 refills | Status: DC | PRN

## 2020-06-20 NOTE — Addendum Note (Signed)
Addended by: Caro Hight on: 06/20/2020 11:26 AM   Modules accepted: Orders

## 2020-06-20 NOTE — Patient Instructions (Signed)
1. Will decrease her Citalopram/Celexa to 20 mg daily for depression so we can add Duloxetine to her medicines for nerve and back pain.   2.  Duloxetine /Cymbalta 30 mg nightly x 1 week  Then 60 mg nightly- for nerve pain  1% of patients can have nausea with Duloxetine- call me if needs an anti-nausea medicine. Can also cause mild dry mouth/dry eyes and mild constipation.  3. Zofran- Ondesteron- 4 mg up to 3x/day as needed for nausea.  Just in case!  4. Order xrays of lumbar spine to look at if has compression. Might need MRI in future.   5. Can entertain the idea of tramadol in future if cannot get pain controlled.   6. Pt can stop Celebrex since not working and could affect kidneys   7. F/U in 6 weeks.

## 2020-06-20 NOTE — Progress Notes (Signed)
Subjective:    Patient ID: Alicia Arnold, female    DOB: Nov 03, 1945, 74 y.o.   MRN: [id number]  HPI  Patient is a 74 yr old female with lumbar spinal stenosis, CKD Stage III, and migraines here for low back pain evaluation with radiculopathy. .   Has had back pain- progressively worse- 4-5 years, but getting the point it's interfering in ADLs.  More to the left - starts in the midline.    Lately, having more pain up mid back. Around bra strap.   Have had Xrays at Ortho's office, but no other imaging.   Starts in if on feet for awhile- has ot sit down,, gets so much pain.  Is a throbbing pain that won't let up.   Rubs icy hot on it- has a cooling sensation, but not real helpful.   Taking Celebrex- 2x/day- from Ortho.  Doesn't help pain- might take the edge off some, but doesn't do much overall. 200 mg BID.    Isn't anything can't do except jumping rope- due to back pain.    Also legs are hurting from knees on down as well.   The pain in R mid thigh has gone away- was seen last fall for it by Neurology in [place].    Never tried any nerve pain meds for back pain.    Has a physician in [place], [place] that prescribes the Xanax-    3 ESI's in past- didn't help. Made her sick. Was to the point of vomiting with the first one.   Takes Xanax as needed- but says takes it rarely   Does exercise almost every day.    Social hx:  Lost one of her children in 2012.  Not smoker Still driving- completely independent.   Pain Inventory Average Pain 8 Pain Right Now 0 My pain is intermittent, stabbing, tingling and aching  In the last 24 hours, has pain interfered with the following? General activity 6 Relation with others 3 Enjoyment of life 3 What TIME of day is your pain at its worst? daytime Sleep (in general) Fair  Pain is worse with: walking, standing and some activites Pain improves with: rest and medication Relief from Meds: 1  walk without assistance ability to climb  steps?  yes do you drive?  no  retired  dizziness depression anxiety  Any changes since last visit?  no  Primary care [facility] in [place]    Family History  Problem Relation Age of Onset  . Osteoporosis Mother   . Hyperlipidemia Mother   . CVA Father   . Hypertension Father    Social History   Socioeconomic History  . Marital status: Divorced    Spouse name: Not on file  . Number of children: Not on file  . Years of education: Not on file  . Highest education level: Not on file  Occupational History  . Not on file  Tobacco Use  . Smoking status: Never Smoker  . Smokeless tobacco: Never Used  Vaping Use  . Vaping Use: Never used  Substance and Sexual Activity  . Alcohol use: Yes    Comment: Socially  . Drug use: Never  . Sexual activity: Not on file  Other Topics Concern  . 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 Difficulty of Paying Living Expenses: Not on file  Food Insecurity:   . Worried About Charity fundraiser in the Last Year: Not on file  . Ran Out of Food in the Last Year: Not on file  Transportation Needs:   . Lack of Transportation (Medical): Not on file  . Lack of Transportation (Non-Medical): Not on file  Physical Activity:   . Days of Exercise per Week: Not on file  . Minutes of Exercise per Session: Not on file  Stress:   . Feeling of Stress : Not on file  Social Connections:   . Frequency of Communication with Friends and Family: Not on file  . Frequency of Social Gatherings with Friends and Family: Not on file  . Attends Religious Services: Not on file  . Active Member of Clubs or Organizations: Not on file  . Attends Archivist Meetings: Not on file  . Marital Status: Not on file   Past Surgical History:  Procedure  Laterality Date  . carpal tunnel relase    . CERVICAL LAMINECTOMY     Past Medical History:  Diagnosis Date  . COPD (chronic obstructive pulmonary disease) ([place])   . Depression   . Female bladder prolapse   . HTN (hypertension)   . Hyperlipidemia   . Spinal stenosis of lumbar region with radiculopathy   . Stage 3 chronic kidney disease    BP (!) 147/82   Pulse 60   Temp 98.2 F (36.8 C)   Ht 5\' 5"  (1.651 m)   Wt 141 lb 3.2 oz (64 kg)   SpO2 97%   BMI 23.50 kg/m   Opioid Risk Score:   Fall Risk Score:  `1  Depression screen PHQ 2/9  Depression screen PHQ 2/9 06/20/2020  Decreased Interest 3  Down, Depressed, Hopeless 0  PHQ - 2 Score 3  Altered sleeping 0  Tired, decreased energy 0  Change in appetite 0  Feeling bad or failure about yourself  0  Trouble concentrating 0  Moving slowly or fidgety/restless 0  Suicidal thoughts 0  PHQ-9 Score 3  Difficult doing work/chores Not difficult at all    Review of Systems  Constitutional: Negative.   HENT: Negative.   Eyes: Negative.   Respiratory: Negative.   Cardiovascular: Positive for palpitations.  Gastrointestinal: Negative.   Endocrine: Negative.   Genitourinary: Negative.   Musculoskeletal: Positive for back pain and myalgias.  Skin: Negative.   Neurological: Positive for dizziness.  Hematological: Negative.   Psychiatric/Behavioral: Positive for dysphoric mood. The patient is nervous/anxious.   All other systems reviewed and are negative.      Objective:   Physical Exam Awake, alert, appropriate, sitting on table, legs crossed, BMI WNL; NAD MS: 5/5 in LEs in HF, KE, DF and PF B/L  Neuro Sensation to light touch intact B/L in LEs No (+) SLR B/L Lumbar extension with rotation causes pain on R with L rotation- but not on L side/with R rotation.  No pain with lumbar flexion- can touch toes B/L         Assessment & Plan:    Patient is a 74 yr old female with lumbar spinal stenosis, CKD Stage III,  and migraines here for low back pain evaluation with radiculopathy. .   1. Will decrease her Citalopram/Celexa to 20 mg daily for depression so we can add Duloxetine to her medicines for nerve and back pain.   2.  Duloxetine Alicia Arnold 30  mg nightly x 1 week  Then 60 mg nightly- for nerve pain  1% of patients can have nausea with Duloxetine- call me if needs an anti-nausea medicine. Can also cause mild dry mouth/dry eyes and mild constipation.  3. Zofran- Ondesteron- 4 mg up to 3x/day as needed for nausea.  Just in case!  4. Order xrays of lumbar spine to look at if has compression. Might need MRI in future.   5. Can entertain the idea of tramadol in future if cannot get pain controlled.   6. Pt can stop Celebrex since not working and could affect kidneys   7. F/U in 6 weeks.    I spent a total of 45 minutes on visit- as detailed above.

## 2020-07-17 DIAGNOSIS — H16103 Unspecified superficial keratitis, bilateral: Secondary | ICD-10-CM | POA: Diagnosis not present

## 2020-07-17 DIAGNOSIS — H04123 Dry eye syndrome of bilateral lacrimal glands: Secondary | ICD-10-CM | POA: Diagnosis not present

## 2020-07-17 DIAGNOSIS — H40011 Open angle with borderline findings, low risk, right eye: Secondary | ICD-10-CM | POA: Diagnosis not present

## 2020-07-17 DIAGNOSIS — H401122 Primary open-angle glaucoma, left eye, moderate stage: Secondary | ICD-10-CM | POA: Diagnosis not present

## 2020-08-01 ENCOUNTER — Encounter: Payer: Self-pay | Admitting: Physical Medicine and Rehabilitation

## 2020-08-01 ENCOUNTER — Other Ambulatory Visit: Payer: Self-pay

## 2020-08-01 ENCOUNTER — Encounter
Payer: Medicare Other | Attending: Physical Medicine and Rehabilitation | Admitting: Physical Medicine and Rehabilitation

## 2020-08-01 VITALS — BP 122/73 | HR 61 | Temp 97.8°F | Ht 65.0 in | Wt 141.0 lb

## 2020-08-01 DIAGNOSIS — G8929 Other chronic pain: Secondary | ICD-10-CM | POA: Insufficient documentation

## 2020-08-01 DIAGNOSIS — G894 Chronic pain syndrome: Secondary | ICD-10-CM | POA: Insufficient documentation

## 2020-08-01 DIAGNOSIS — M5116 Intervertebral disc disorders with radiculopathy, lumbar region: Secondary | ICD-10-CM | POA: Diagnosis not present

## 2020-08-01 DIAGNOSIS — M5442 Lumbago with sciatica, left side: Secondary | ICD-10-CM | POA: Diagnosis not present

## 2020-08-01 NOTE — Progress Notes (Signed)
Subjective:    Patient ID: Alicia Arnold, female    DOB: Mar 16, 1946, 74 y.o.   MRN: [id number]  HPI   Patient is a 74 yr old female with lumbar spinal stenosis, CKD Stage III, and migraines here for low back pain  with radiculopathy.   Here for f/u.    Last visit rated pain as 8/10- now rating on paperwork as 3/10.   Back isn't hurting like it was-  If shops for awhile, tinge of pain will come back.  Spasm will act up.   HA's that make her sick.    Pain Inventory Average Pain 3 Pain Right Now 3 My pain is aching  In the last 24 hours, has pain interfered with the following? General activity 0 Relation with others 0 Enjoyment of life 0 What TIME of day is your pain at its worst? evening Sleep (in general) Fair  Pain is worse with: walking and standing Pain improves with: medication Relief from Meds: 8  Family History  Problem Relation Age of Onset  . Osteoporosis Mother   . Hyperlipidemia Mother   . CVA Father   . Hypertension Father    Social History   Socioeconomic History  . Marital status: Divorced    Spouse name: Not on file  . Number of children: Not on file  . Years of education: Not on file  . Highest education level: Not on file  Occupational History  . Not on file  Tobacco Use  . Smoking status: Never Smoker  . Smokeless tobacco: Never Used  Vaping Use  . Vaping Use: Never used  Substance and Sexual Activity  . Alcohol use: Yes    Comment: Socially  . Drug use: Never  . Sexual activity: Not on file  Other Topics Concern  . 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 Difficulty of Paying Living Expenses: Not on file  Food Insecurity:   . Worried About Charity fundraiser in the Last Year: Not on file  . Ran Out of Food in the Last Year: Not on file    Transportation Needs:   . Lack of Transportation (Medical): Not on file  . Lack of Transportation (Non-Medical): Not on file  Physical Activity:   . Days of Exercise per Week: Not on file  . Minutes of Exercise per Session: Not on file  Stress:   . Feeling of Stress : Not on file  Social Connections:   . Frequency of Communication with Friends and Family: Not on file  . Frequency of Social Gatherings with Friends and Family: Not on file  . Attends Religious Services: Not on file  . Active Member of Clubs or Organizations: Not on file  . Attends Archivist Meetings: Not on file  . Marital Status: Not on file   Past Surgical History:  Procedure Laterality Date  . carpal tunnel relase    . CERVICAL LAMINECTOMY     Past Surgical History:  Procedure Laterality Date  . carpal tunnel relase    . CERVICAL LAMINECTOMY     Past Medical History:  Diagnosis Date  . COPD (chronic obstructive pulmonary disease) ([place])   . Depression   . Female bladder prolapse   . HTN (hypertension)   .  Hyperlipidemia   . Spinal stenosis of lumbar region with radiculopathy   . Stage 3 chronic kidney disease    BP 122/73   Pulse 61   Temp 97.8 F (36.6 C)   Ht 5\' 5"  (1.651 m)   Wt 141 lb (64 kg)   SpO2 93%   BMI 23.46 kg/m   Opioid Risk Score:   Fall Risk Score:  `1  Depression screen PHQ 2/9  Depression screen PHQ 2/9 06/20/2020  Decreased Interest 3  Down, Depressed, Hopeless 0  PHQ - 2 Score 3  Altered sleeping 0  Tired, decreased energy 0  Change in appetite 0  Feeling bad or failure about yourself  0  Trouble concentrating 0  Moving slowly or fidgety/restless 0  Suicidal thoughts 0  PHQ-9 Score 3  Difficult doing work/chores Not difficult at all    Review of Systems  Musculoskeletal: Positive for back pain.       Spasms Pain in back of neck  All other systems reviewed and are negative.      Objective:   Physical Exam Awake, alert, appropriate, sitting up on  table, NAD Less TTP over low back than was last visit Has trigger points in L rhomboids and B/L upper traps and splenius capitus        Assessment & Plan:    Patient is a 74 yr old female with lumbar spinal stenosis, CKD Stage III, and migraines here for low back pain  with radiculopathy.   1. Tennis ball- suggest- set of 3- if too hard, can throw in dryer for cycle- will make it less firm.  2-4 minutes on muscle spasms- don't massage- just hold pressure- and can use 2 balls or a Theracane - can get online- and again, holding pressure, not massage int he areas that are painful/spasming.    2. Magnesium 400mg - start with 1 tab/day- can work up to max of 3 tabs/day. Only side is loose stools-  Magnesium is used as muscle relaxant. Needs to be taken regularly.   3. If these are not effective, we will schedule trigger point injections at next visit.   4. F/U in 6 weeks- for trigger point injections  I spent a total of 25 minutes on visit- as detailed above.

## 2020-08-01 NOTE — Patient Instructions (Signed)
  Patient is a 74 yr old female with lumbar spinal stenosis, CKD Stage III, and migraines here for low back pain  with radiculopathy.   1. Tennis ball- suggest- set of 3- if too hard, can throw in dryer for cycle- will make it less firm.  2-4 minutes on muscle spasms- don't massage- just hold pressure- and can use 2 balls or a Theracane - can get online- and again, holding pressure, not massage int he areas that are painful/spasming.    2. Magnesium 400mg - start with 1 tab/day- can work up to max of 3 tabs/day. Only side is loose stools-  Magnesium is used as muscle relaxant. Needs to be taken regularly.   3. If these are not effective, we will schedule trigger point injections at next visit.   4. F/U in 6 weeks- for trigger point injections

## 2020-08-05 DIAGNOSIS — H02056 Trichiasis without entropian left eye, unspecified eyelid: Secondary | ICD-10-CM | POA: Diagnosis not present

## 2020-08-11 DIAGNOSIS — Z23 Encounter for immunization: Secondary | ICD-10-CM | POA: Diagnosis not present

## 2020-08-11 DIAGNOSIS — B9689 Other specified bacterial agents as the cause of diseases classified elsewhere: Secondary | ICD-10-CM | POA: Diagnosis not present

## 2020-08-11 DIAGNOSIS — N760 Acute vaginitis: Secondary | ICD-10-CM | POA: Diagnosis not present

## 2020-08-11 DIAGNOSIS — Z6822 Body mass index (BMI) 22.0-22.9, adult: Secondary | ICD-10-CM | POA: Diagnosis not present

## 2020-08-19 DIAGNOSIS — E785 Hyperlipidemia, unspecified: Secondary | ICD-10-CM | POA: Diagnosis not present

## 2020-08-21 DIAGNOSIS — H40011 Open angle with borderline findings, low risk, right eye: Secondary | ICD-10-CM | POA: Diagnosis not present

## 2020-08-21 DIAGNOSIS — H401122 Primary open-angle glaucoma, left eye, moderate stage: Secondary | ICD-10-CM | POA: Diagnosis not present

## 2020-08-21 DIAGNOSIS — H04123 Dry eye syndrome of bilateral lacrimal glands: Secondary | ICD-10-CM | POA: Diagnosis not present

## 2020-08-25 DIAGNOSIS — E785 Hyperlipidemia, unspecified: Secondary | ICD-10-CM | POA: Diagnosis not present

## 2020-08-25 DIAGNOSIS — F325 Major depressive disorder, single episode, in full remission: Secondary | ICD-10-CM | POA: Diagnosis not present

## 2020-08-25 DIAGNOSIS — Z79899 Other long term (current) drug therapy: Secondary | ICD-10-CM | POA: Diagnosis not present

## 2020-08-25 DIAGNOSIS — Z6822 Body mass index (BMI) 22.0-22.9, adult: Secondary | ICD-10-CM | POA: Diagnosis not present

## 2020-09-03 DIAGNOSIS — L820 Inflamed seborrheic keratosis: Secondary | ICD-10-CM | POA: Diagnosis not present

## 2020-09-03 DIAGNOSIS — L814 Other melanin hyperpigmentation: Secondary | ICD-10-CM | POA: Diagnosis not present

## 2020-09-03 DIAGNOSIS — C44712 Basal cell carcinoma of skin of right lower limb, including hip: Secondary | ICD-10-CM | POA: Diagnosis not present

## 2020-09-03 DIAGNOSIS — L578 Other skin changes due to chronic exposure to nonionizing radiation: Secondary | ICD-10-CM | POA: Diagnosis not present

## 2020-09-03 DIAGNOSIS — B079 Viral wart, unspecified: Secondary | ICD-10-CM | POA: Diagnosis not present

## 2020-09-03 DIAGNOSIS — L570 Actinic keratosis: Secondary | ICD-10-CM | POA: Diagnosis not present

## 2020-09-08 DIAGNOSIS — Z6822 Body mass index (BMI) 22.0-22.9, adult: Secondary | ICD-10-CM | POA: Diagnosis not present

## 2020-09-08 DIAGNOSIS — N760 Acute vaginitis: Secondary | ICD-10-CM | POA: Diagnosis not present

## 2020-09-10 ENCOUNTER — Encounter
Payer: Medicare Other | Attending: Physical Medicine and Rehabilitation | Admitting: Physical Medicine and Rehabilitation

## 2020-09-10 ENCOUNTER — Other Ambulatory Visit: Payer: Self-pay

## 2020-09-10 ENCOUNTER — Encounter: Payer: Self-pay | Admitting: Physical Medicine and Rehabilitation

## 2020-09-10 VITALS — BP 128/77 | HR 78 | Temp 98.0°F | Ht 65.0 in | Wt 141.4 lb

## 2020-09-10 DIAGNOSIS — G894 Chronic pain syndrome: Secondary | ICD-10-CM | POA: Insufficient documentation

## 2020-09-10 DIAGNOSIS — G8929 Other chronic pain: Secondary | ICD-10-CM | POA: Diagnosis not present

## 2020-09-10 DIAGNOSIS — M5442 Lumbago with sciatica, left side: Secondary | ICD-10-CM

## 2020-09-10 DIAGNOSIS — M7918 Myalgia, other site: Secondary | ICD-10-CM | POA: Insufficient documentation

## 2020-09-10 DIAGNOSIS — G43719 Chronic migraine without aura, intractable, without status migrainosus: Secondary | ICD-10-CM

## 2020-09-10 MED ORDER — DULOXETINE HCL 60 MG PO CPEP: 60.0000 mg | ORAL_CAPSULE | Freq: Every day | ORAL | 3 refills | Status: DC

## 2020-09-10 NOTE — Progress Notes (Signed)
Patient is a 74 yr old female with lumbar spinal stenosis, CKD Stage III, and migraines here for low back pain  with radiculopathy.  Here for trigger point injections.   Doing pretty good since last visit.   Had pain in back of head/neck.   Tennis balls has helped the pain.  Hasn't gotten the theracane yet (sickness in family- hospice yesterday called in).    Magnesium -has tried- 400 mg - 3 tabs/day- not too many loose stools- having regular BMs.     Plan: 1. Patient here for trigger point injections for  Consent done and on chart.  Cleaned areas with alcohol and injected using a 27 gauge 1.5 inch needle  Injected  6cc Using 1% Lidocaine with no EPI  Upper traps B/L Levators B/L Posterior scalenes B/L Middle scalenes Splenius Capitus B/L Pectoralis Major B/L Rhomboids B/L and L x1  Infraspinatus Teres Major/minor Thoracic paraspinals L only  Lumbar paraspinals Other injections-    Patient's level of pain prior was 8/10 Current level of pain after injections is 1/10  There was no bleeding or complications.  Patient was advised to drink a lot of water on day after injections to flush system Will have increased soreness for 12-48 hours after injections.  Can use Lidocaine patches the day AFTER injections Can use theracane on day of injections in places didn't inject Can use heating pad 4-6 hours AFTER injections   2. 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   3. Continue the Magnesium-   4. F/U in 6 weeks.  5. Can use over the counter lidocaine patches on neck when GETS headache.  If that doesn't work, try some ibuprofen- doesn't have aspirin in it   I spent a total of 25 minutes on visit- as detailed above.

## 2020-09-10 NOTE — Addendum Note (Signed)
Addended by: Courtney Heys on: 09/10/2020 11:22 AM   Modules accepted: Orders

## 2020-09-10 NOTE — Patient Instructions (Addendum)
Plan: 1. Patient here for trigger point injections for  Consent done and on chart.  Cleaned areas with alcohol and injected using a 27 gauge 1.5 inch needle  Injected  6cc Using 1% Lidocaine with no EPI  Upper traps B/L Levators B/L Posterior scalenes B/L Middle scalenes Splenius Capitus B/L Pectoralis Major B/L Rhomboids B/L and L x1  Infraspinatus Teres Major/minor Thoracic paraspinals L only  Lumbar paraspinals Other injections-    Patient's level of pain prior was 8/10 Current level of pain after injections is 1/10  There was no bleeding or complications.  Patient was advised to drink a lot of water on day after injections to flush system Will have increased soreness for 12-48 hours after injections.  Can use Lidocaine patches the day AFTER injections Can use theracane on day of injections in places didn't inject Can use heating pad 4-6 hours AFTER injections   2. 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   3. Continue the Magnesium-   4. F/U in 6 weeks. 5. Can use over the counter lidocaine patches on neck when GETS headache.  If that doesn't work, try some ibuprofen- doesn't have aspirin in it

## 2020-09-24 DIAGNOSIS — N182 Chronic kidney disease, stage 2 (mild): Secondary | ICD-10-CM | POA: Diagnosis not present

## 2020-09-24 DIAGNOSIS — I129 Hypertensive chronic kidney disease with stage 1 through stage 4 chronic kidney disease, or unspecified chronic kidney disease: Secondary | ICD-10-CM | POA: Diagnosis not present

## 2020-09-24 DIAGNOSIS — Z6822 Body mass index (BMI) 22.0-22.9, adult: Secondary | ICD-10-CM | POA: Diagnosis not present

## 2020-09-26 DIAGNOSIS — I10 Essential (primary) hypertension: Secondary | ICD-10-CM | POA: Diagnosis not present

## 2020-09-26 DIAGNOSIS — W57XXXA Bitten or stung by nonvenomous insect and other nonvenomous arthropods, initial encounter: Secondary | ICD-10-CM | POA: Diagnosis not present

## 2020-09-26 DIAGNOSIS — Z6823 Body mass index (BMI) 23.0-23.9, adult: Secondary | ICD-10-CM | POA: Diagnosis not present

## 2020-09-26 DIAGNOSIS — S50861A Insect bite (nonvenomous) of right forearm, initial encounter: Secondary | ICD-10-CM | POA: Diagnosis not present

## 2020-10-08 DIAGNOSIS — N182 Chronic kidney disease, stage 2 (mild): Secondary | ICD-10-CM | POA: Diagnosis not present

## 2020-10-08 DIAGNOSIS — Z6823 Body mass index (BMI) 23.0-23.9, adult: Secondary | ICD-10-CM | POA: Diagnosis not present

## 2020-10-08 DIAGNOSIS — I129 Hypertensive chronic kidney disease with stage 1 through stage 4 chronic kidney disease, or unspecified chronic kidney disease: Secondary | ICD-10-CM | POA: Diagnosis not present

## 2020-10-27 ENCOUNTER — Encounter: Payer: Medicare Other | Admitting: Physical Medicine and Rehabilitation

## 2020-10-31 DIAGNOSIS — N952 Postmenopausal atrophic vaginitis: Secondary | ICD-10-CM | POA: Diagnosis not present

## 2020-10-31 DIAGNOSIS — F32A Depression, unspecified: Secondary | ICD-10-CM | POA: Diagnosis not present

## 2020-10-31 DIAGNOSIS — R102 Pelvic and perineal pain: Secondary | ICD-10-CM | POA: Diagnosis not present

## 2020-10-31 DIAGNOSIS — F419 Anxiety disorder, unspecified: Secondary | ICD-10-CM | POA: Diagnosis not present

## 2020-11-24 DIAGNOSIS — Z1152 Encounter for screening for COVID-19: Secondary | ICD-10-CM | POA: Diagnosis not present

## 2020-11-24 DIAGNOSIS — J020 Streptococcal pharyngitis: Secondary | ICD-10-CM | POA: Diagnosis not present

## 2020-11-24 DIAGNOSIS — I129 Hypertensive chronic kidney disease with stage 1 through stage 4 chronic kidney disease, or unspecified chronic kidney disease: Secondary | ICD-10-CM | POA: Diagnosis not present

## 2020-11-24 DIAGNOSIS — Z20822 Contact with and (suspected) exposure to covid-19: Secondary | ICD-10-CM | POA: Diagnosis not present

## 2020-11-24 DIAGNOSIS — N182 Chronic kidney disease, stage 2 (mild): Secondary | ICD-10-CM | POA: Diagnosis not present

## 2020-11-24 DIAGNOSIS — R030 Elevated blood-pressure reading, without diagnosis of hypertension: Secondary | ICD-10-CM | POA: Diagnosis not present

## 2020-12-01 ENCOUNTER — Encounter: Payer: Medicare Other | Admitting: Physical Medicine and Rehabilitation

## 2020-12-03 DIAGNOSIS — H401122 Primary open-angle glaucoma, left eye, moderate stage: Secondary | ICD-10-CM | POA: Diagnosis not present

## 2020-12-03 DIAGNOSIS — H04123 Dry eye syndrome of bilateral lacrimal glands: Secondary | ICD-10-CM | POA: Diagnosis not present

## 2020-12-03 DIAGNOSIS — Z961 Presence of intraocular lens: Secondary | ICD-10-CM | POA: Diagnosis not present

## 2020-12-22 DIAGNOSIS — E785 Hyperlipidemia, unspecified: Secondary | ICD-10-CM | POA: Diagnosis not present

## 2020-12-29 DIAGNOSIS — E785 Hyperlipidemia, unspecified: Secondary | ICD-10-CM | POA: Diagnosis not present

## 2020-12-29 DIAGNOSIS — Z139 Encounter for screening, unspecified: Secondary | ICD-10-CM | POA: Diagnosis not present

## 2020-12-29 DIAGNOSIS — N182 Chronic kidney disease, stage 2 (mild): Secondary | ICD-10-CM | POA: Diagnosis not present

## 2020-12-29 DIAGNOSIS — I129 Hypertensive chronic kidney disease with stage 1 through stage 4 chronic kidney disease, or unspecified chronic kidney disease: Secondary | ICD-10-CM | POA: Diagnosis not present

## 2020-12-29 DIAGNOSIS — J449 Chronic obstructive pulmonary disease, unspecified: Secondary | ICD-10-CM | POA: Diagnosis not present

## 2021-01-19 DIAGNOSIS — J309 Allergic rhinitis, unspecified: Secondary | ICD-10-CM | POA: Diagnosis not present

## 2021-01-20 DIAGNOSIS — J309 Allergic rhinitis, unspecified: Secondary | ICD-10-CM | POA: Diagnosis not present

## 2021-01-28 DIAGNOSIS — L814 Other melanin hyperpigmentation: Secondary | ICD-10-CM | POA: Diagnosis not present

## 2021-01-28 DIAGNOSIS — L820 Inflamed seborrheic keratosis: Secondary | ICD-10-CM | POA: Diagnosis not present

## 2021-01-28 DIAGNOSIS — L578 Other skin changes due to chronic exposure to nonionizing radiation: Secondary | ICD-10-CM | POA: Diagnosis not present

## 2021-01-29 DIAGNOSIS — Z139 Encounter for screening, unspecified: Secondary | ICD-10-CM | POA: Diagnosis not present

## 2021-01-29 DIAGNOSIS — Z91018 Allergy to other foods: Secondary | ICD-10-CM | POA: Diagnosis not present

## 2021-01-29 DIAGNOSIS — Z9109 Other allergy status, other than to drugs and biological substances: Secondary | ICD-10-CM | POA: Diagnosis not present

## 2021-01-29 DIAGNOSIS — Z1331 Encounter for screening for depression: Secondary | ICD-10-CM | POA: Diagnosis not present

## 2021-01-29 DIAGNOSIS — Z0000 Encounter for general adult medical examination without abnormal findings: Secondary | ICD-10-CM | POA: Diagnosis not present

## 2021-01-29 DIAGNOSIS — Z1339 Encounter for screening examination for other mental health and behavioral disorders: Secondary | ICD-10-CM | POA: Diagnosis not present

## 2021-01-29 DIAGNOSIS — Z136 Encounter for screening for cardiovascular disorders: Secondary | ICD-10-CM | POA: Diagnosis not present

## 2021-02-19 DIAGNOSIS — R5383 Other fatigue: Secondary | ICD-10-CM | POA: Diagnosis not present

## 2021-02-19 DIAGNOSIS — Z6822 Body mass index (BMI) 22.0-22.9, adult: Secondary | ICD-10-CM | POA: Diagnosis not present

## 2021-02-19 DIAGNOSIS — Z1321 Encounter for screening for nutritional disorder: Secondary | ICD-10-CM | POA: Diagnosis not present

## 2021-02-26 DIAGNOSIS — R5383 Other fatigue: Secondary | ICD-10-CM | POA: Diagnosis not present

## 2021-02-26 DIAGNOSIS — R232 Flushing: Secondary | ICD-10-CM | POA: Diagnosis not present

## 2021-02-26 DIAGNOSIS — Z6822 Body mass index (BMI) 22.0-22.9, adult: Secondary | ICD-10-CM | POA: Diagnosis not present

## 2021-02-27 ENCOUNTER — Other Ambulatory Visit: Payer: Self-pay

## 2021-02-27 ENCOUNTER — Encounter
Payer: Medicare Other | Attending: Physical Medicine and Rehabilitation | Admitting: Physical Medicine and Rehabilitation

## 2021-02-27 ENCOUNTER — Encounter: Payer: Self-pay | Admitting: Physical Medicine and Rehabilitation

## 2021-02-27 VITALS — BP 137/81 | HR 64 | Temp 97.9°F | Ht 65.0 in | Wt 140.0 lb

## 2021-02-27 DIAGNOSIS — G8929 Other chronic pain: Secondary | ICD-10-CM | POA: Diagnosis not present

## 2021-02-27 DIAGNOSIS — G894 Chronic pain syndrome: Secondary | ICD-10-CM

## 2021-02-27 DIAGNOSIS — M5442 Lumbago with sciatica, left side: Secondary | ICD-10-CM

## 2021-02-27 DIAGNOSIS — M5116 Intervertebral disc disorders with radiculopathy, lumbar region: Secondary | ICD-10-CM | POA: Insufficient documentation

## 2021-02-27 MED ORDER — LEVETIRACETAM 250 MG PO TABS: 250.0000 mg | ORAL_TABLET | Freq: Two times a day (BID) | ORAL | 5 refills | Status: DC

## 2021-02-27 MED ORDER — CITALOPRAM HYDROBROMIDE 40 MG PO TABS: 40.0000 mg | ORAL_TABLET | Freq: Every day | ORAL | 5 refills | Status: DC

## 2021-02-27 NOTE — Progress Notes (Signed)
 Subjective:    Patient ID: Alicia Arnold, female    DOB: 12/27/1945, 75 y.o.   MRN: [id number]  HPI   Patient is a 75 yr old female with lumbar spinal stenosis, CKD Stage III, and migraines here for low back pain with radiculopathy. Here for f/u on LBM with radic.    Wants more trigger point injections again today.  Lasts usually 3 weeks.   Got Lidocaine patches OTC_ helped some- use them in general- somewhat helpful.   Had strep throat a few months ago.   Doesn't feel like Duloxetine helping a lot- so wants to go back to Higher dose of Celexa.    Social Hx: Met a new man- James- so far, dated 1 month.    Pain Inventory Average Pain 8 Pain Right Now 6 My pain is aching  In the last 24 hours, has pain interfered with the following? General activity 6 Relation with others 8 Enjoyment of life 8 What TIME of day is your pain at its worst? daytime Sleep (in general) Good  Pain is worse with: walking and standing Pain improves with: medication Relief from Meds: 6  Family History  Problem Relation Age of Onset  . Osteoporosis Mother   . Hyperlipidemia Mother   . CVA Father   . Hypertension Father    Social History   Socioeconomic History  . Marital status: Divorced    Spouse name: Not on file  . Number of children: Not on file  . Years of education: Not on file  . Highest education level: Not on file  Occupational History  . Not on file  Tobacco Use  . Smoking status: Never Smoker  . Smokeless tobacco: Never Used  Vaping Use  . Vaping Use: Never used  Substance and Sexual Activity  . Alcohol use: Yes    Comment: Socially  . Drug use: Never  . Sexual activity: Not on file  Other Topics Concern  . 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 carpal tunnel relase    . CERVICAL LAMINECTOMY     Past Surgical History:  Procedure Laterality Date  . carpal tunnel relase    . CERVICAL LAMINECTOMY     Past Medical History:  Diagnosis Date  . COPD (chronic obstructive pulmonary disease) (HCC)   . Depression   . Female bladder prolapse   . HTN (hypertension)   . Hyperlipidemia   . Spinal stenosis of lumbar region with radiculopathy   . Stage 3 chronic kidney disease (HCC)    BP 137/81   Pulse 64   Temp 97.9 F (36.6 C)   Ht 5' 5" (1.651 m)   Wt 140 lb (63.5 kg)   SpO2 95%   BMI 23.30 kg/m   Opioid Risk Score:   Fall Risk Score:  `1  Depression screen PHQ 2/9  Depression screen PHQ 2/9 06/20/2020  Decreased Interest 3  Down, Depressed, Hopeless 0  PHQ - 2 Score 3  Altered sleeping 0  Tired, decreased energy 0    Change in appetite 0  Feeling bad or failure about yourself  0  Trouble concentrating 0  Moving slowly or fidgety/restless 0  Suicidal thoughts 0  PHQ-9 Score 3  Difficult doing work/chores Not difficult at all    Review of Systems  Constitutional: Negative.   HENT: Negative.   Eyes: Negative.   Respiratory: Negative.   Cardiovascular: Negative.   Gastrointestinal: Negative.   Endocrine: Negative.   Genitourinary: Negative.   Musculoskeletal: Positive for back pain and neck pain.  Skin: Negative.   Allergic/Immunologic: Negative.   Neurological: Negative.   Hematological: Negative.   Psychiatric/Behavioral: Negative.   All other systems reviewed and are negative.      Objective:   Physical Exam Awake, alert, appropriate, appears slightly depressed this AM-but perked up with injections, low energy more than anything. NAD TrP's noted in neck. Upper back, shoulders and low back.  Esp in L>R upper traps, scalenes  and pecs B/L      Assessment & Plan:   Patient is a 75 yr old female with lumbar spinal stenosis, CKD Stage III, and migraines here for low back pain with radiculopathy. Here for f/u on LBM with radic.   1. Will wean off Duloxetine- do 30 mg daily for 3-7 days, then can stop- if pain gets worse, let me know, and can represcribe Duloxetine.   2. Will increase Celexa 40 mg daily for mood. Has had Dysthymia/low level depression.   3. Doesn't want to try Lyrica- due to side effect profile as well as Gabapentin.   4. We discussed tramadol vs Keppra for pain. Do Keppra/wait on Tramadol  5. Will do Keppra-  250 mg 2x/day x 1 week then 500 mg 2x/day- max dose is 1000 mg BID.   6. Hasn't gotten Theracane yet- suggest getting- will increase time injections last. Can use 2-4 minutes on each trigger point - youtube has great videos on how to use.   7. Patient here for trigger point injections for  Consent done and on chart.  Cleaned areas with alcohol and injected using a 27 gauge 1.5 inch needle  Injected  5cc Using 1% Lidocaine with no EPI  Upper traps B/L Levators B/L Posterior scalenes B/L Middle scalenes Splenius Capitus B/L Pectoralis Major B/L Rhomboids  Infraspinatus Teres Major/minor Thoracic paraspinals B/L Lumbar paraspinals B/L Other injections-    Patient's level of pain prior was 8-9/10 Current level of pain after injections is no pain right now  There was no bleeding or complications.  Patient was advised to drink a lot of water on day after injections to flush system Will have increased soreness for 12-48 hours after injections.  Can use Lidocaine patches the day AFTER injections Can use theracane on day of injections in places didn't inject Can use heating pad 4-6 hours AFTER injections  8. F/U in 2 months   

## 2021-02-27 NOTE — Patient Instructions (Signed)
Patient is a 75 yr old female with lumbar spinal stenosis, CKD Stage III, and migraines here for low back pain with radiculopathy. Here for f/u on LBM with radic.   1. Will wean off Duloxetine- do 30 mg daily for 3-7 days, then can stop- if pain gets worse, let me know, and can represcribe Duloxetine.   2. Will increase Celexa 40 mg daily for mood. Has had Dysthymia/low level depression.   3. Doesn't want to try Lyrica- due to side effect profile as well as Gabapentin.   4. We discussed tramadol vs Keppra for pain. Do Keppra/wait on Tramadol  5. Will do Keppra-  250 mg 2x/day x 1 week then 500 mg 2x/day- max dose is 1000 mg BID.   6. Hasn't gotten Theracane yet- suggest getting- will increase time injections last. Can use 2-4 minutes on each trigger point - youtube has great videos on how to use.   7. Patient here for trigger point injections for  Consent done and on chart.  Cleaned areas with alcohol and injected using a 27 gauge 1.5 inch needle  Injected  5cc Using 1% Lidocaine with no EPI  Upper traps B/L Levators B/L Posterior scalenes B/L Middle scalenes Splenius Capitus B/L Pectoralis Major B/L Rhomboids  Infraspinatus Teres Major/minor Thoracic paraspinals B/L Lumbar paraspinals B/L Other injections-    Patient's level of pain prior was 8-9/10 Current level of pain after injections is no pain right now  There was no bleeding or complications.  Patient was advised to drink a lot of water on day after injections to flush system Will have increased soreness for 12-48 hours after injections.  Can use Lidocaine patches the day AFTER injections Can use theracane on day of injections in places didn't inject Can use heating pad 4-6 hours AFTER injections  8. F/U in 2 months

## 2021-03-03 DIAGNOSIS — Z6822 Body mass index (BMI) 22.0-22.9, adult: Secondary | ICD-10-CM | POA: Diagnosis not present

## 2021-03-03 DIAGNOSIS — A6009 Herpesviral infection of other urogenital tract: Secondary | ICD-10-CM | POA: Diagnosis not present

## 2021-03-04 ENCOUNTER — Telehealth: Payer: Self-pay | Admitting: *Deleted

## 2021-03-04 NOTE — Telephone Encounter (Signed)
Mrs Doble called and reports that the Cymbalta made her feel like a zombie. She was not able to get up out of her chair. She says she will need something else.

## 2021-03-04 NOTE — Telephone Encounter (Signed)
L/M for pt on voicemail saying to call me back- likely tomorrow Can stop Keppra (not cymbalta) by going back to 250 mg BID x 3 days, then stop.  Our next option is Tramadol- don't really have many other options right now for her pain.  However will wait until we talk before prescribing this medicine.  Thanks, ML

## 2021-04-01 DIAGNOSIS — H04123 Dry eye syndrome of bilateral lacrimal glands: Secondary | ICD-10-CM | POA: Diagnosis not present

## 2021-04-01 DIAGNOSIS — H401122 Primary open-angle glaucoma, left eye, moderate stage: Secondary | ICD-10-CM | POA: Diagnosis not present

## 2021-04-01 DIAGNOSIS — H5212 Myopia, left eye: Secondary | ICD-10-CM | POA: Diagnosis not present

## 2021-04-10 DIAGNOSIS — M25532 Pain in left wrist: Secondary | ICD-10-CM | POA: Diagnosis not present

## 2021-04-10 DIAGNOSIS — M79642 Pain in left hand: Secondary | ICD-10-CM | POA: Diagnosis not present

## 2021-04-10 DIAGNOSIS — Y92009 Unspecified place in unspecified non-institutional (private) residence as the place of occurrence of the external cause: Secondary | ICD-10-CM | POA: Diagnosis not present

## 2021-04-10 DIAGNOSIS — W19XXXA Unspecified fall, initial encounter: Secondary | ICD-10-CM | POA: Diagnosis not present

## 2021-04-29 ENCOUNTER — Encounter: Payer: Self-pay | Admitting: Physical Medicine and Rehabilitation

## 2021-04-29 ENCOUNTER — Other Ambulatory Visit: Payer: Self-pay

## 2021-04-29 ENCOUNTER — Encounter
Payer: Medicare Other | Attending: Physical Medicine and Rehabilitation | Admitting: Physical Medicine and Rehabilitation

## 2021-04-29 VITALS — BP 132/81 | HR 54 | Temp 97.8°F | Ht 65.0 in | Wt 140.2 lb

## 2021-04-29 DIAGNOSIS — M5116 Intervertebral disc disorders with radiculopathy, lumbar region: Secondary | ICD-10-CM | POA: Diagnosis not present

## 2021-04-29 DIAGNOSIS — M5442 Lumbago with sciatica, left side: Secondary | ICD-10-CM | POA: Diagnosis not present

## 2021-04-29 DIAGNOSIS — G8929 Other chronic pain: Secondary | ICD-10-CM | POA: Diagnosis not present

## 2021-04-29 DIAGNOSIS — G894 Chronic pain syndrome: Secondary | ICD-10-CM

## 2021-04-29 DIAGNOSIS — M7918 Myalgia, other site: Secondary | ICD-10-CM

## 2021-04-29 MED ORDER — DULOXETINE HCL 60 MG PO CPEP: 60.0000 mg | ORAL_CAPSULE | Freq: Every day | ORAL | 3 refills | Status: DC

## 2021-04-29 NOTE — Progress Notes (Signed)
Patient is a 75 yr old female with lumbar spinal stenosis, CKD Stage III, and migraines here for low back pain  with radiculopathy.  Here for f/u on LBM with radic.  Has stopped Keppra - made her sick- Cymbalta/Duloxetine- is doing well for her. Is taking 60 mg daily at this time- takes every AM. Feels like it helps her back pain overall- 75% better now overall.   Did trigger point injections last visit.   Has had 1 bad spell for back pain Lidocaine patch helped- OTC- and was real helpful for that spell. Lasted 2 days.   Has pain in neck as well as middle back right now- neck is worse right now- 6/10-   Wears a lumbar brace/corset- wears only if needed when shopping or out of the house.     Plan: Patient here for trigger point injections for  Consent done and on chart.  Cleaned areas with alcohol and injected using a 27 gauge 1.5 inch needle  Injected 6cc Using 1% Lidocaine with no EPI  Upper traps B/L  Levators B/L 2x on L side Posterior scalenes B/L Middle scalenes Splenius Capitus B/L  Pectoralis Major B/L  Rhomboids B/L x2 Infraspinatus Teres Major/minor Thoracic paraspinals B/L  Lumbar paraspinals B/L x2 Other injections- L deltoid  Patient's level of pain prior was 6/10 Current level of pain after injections is  Pain is "gone now"  and turning neck MUCh better There was no bleeding or complications. Patient was advised to drink a lot of water on day after injections to flush system Will have increased soreness for 12-48 hours after injections.  Can use Lidocaine patches the day AFTER injections Can use theracane on day of injections in places didn't inject Can use heating pad 4-6 hours AFTER injections  2. Will continue Duloxetine for nerve/back pain.  Will send in 3 months supply with 3 refills.  3. Will stay off Tramadol.  4. F/U in 2-3 months

## 2021-04-29 NOTE — Patient Instructions (Signed)
Plan: Patient here for trigger point injections for  Consent done and on chart.  Cleaned areas with alcohol and injected using a 27 gauge 1.5 inch needle  Injected 6cc Using 1% Lidocaine with no EPI  Upper traps B/L  Levators B/L 2x on L side Posterior scalenes B/L Middle scalenes Splenius Capitus B/L  Pectoralis Major B/L  Rhomboids B/L x2 Infraspinatus Teres Major/minor Thoracic paraspinals B/L  Lumbar paraspinals B/L x2 Other injections- L deltoid  Patient's level of pain prior was 6/10 Current level of pain after injections is  Pain is "gone now"  and turning neck MUCh better There was no bleeding or complications. Patient was advised to drink a lot of water on day after injections to flush system Will have increased soreness for 12-48 hours after injections.  Can use Lidocaine patches the day AFTER injections Can use theracane on day of injections in places didn't inject Can use heating pad 4-6 hours AFTER injections  2. Will continue Duloxetine for nerve/back pain.  Will send in 3 months supply with 3 refills.  3. Will stay off Tramadol.  4. F/U in 2-3 months 5. Get theracane- hold pressure 2-4 minutes on each pressure point- don't massage area- just hold pressure until it relaxes.

## 2021-05-01 DIAGNOSIS — L820 Inflamed seborrheic keratosis: Secondary | ICD-10-CM | POA: Diagnosis not present

## 2021-05-01 DIAGNOSIS — L570 Actinic keratosis: Secondary | ICD-10-CM | POA: Diagnosis not present

## 2021-05-01 DIAGNOSIS — L814 Other melanin hyperpigmentation: Secondary | ICD-10-CM | POA: Diagnosis not present

## 2021-05-01 DIAGNOSIS — D485 Neoplasm of uncertain behavior of skin: Secondary | ICD-10-CM | POA: Diagnosis not present

## 2021-05-01 DIAGNOSIS — D225 Melanocytic nevi of trunk: Secondary | ICD-10-CM | POA: Diagnosis not present

## 2021-05-01 DIAGNOSIS — D2239 Melanocytic nevi of other parts of face: Secondary | ICD-10-CM | POA: Diagnosis not present

## 2021-05-25 DIAGNOSIS — Z1231 Encounter for screening mammogram for malignant neoplasm of breast: Secondary | ICD-10-CM | POA: Diagnosis not present

## 2021-06-04 DIAGNOSIS — E785 Hyperlipidemia, unspecified: Secondary | ICD-10-CM | POA: Diagnosis not present

## 2021-06-08 DIAGNOSIS — C44529 Squamous cell carcinoma of skin of other part of trunk: Secondary | ICD-10-CM | POA: Diagnosis not present

## 2021-06-11 DIAGNOSIS — E785 Hyperlipidemia, unspecified: Secondary | ICD-10-CM | POA: Diagnosis not present

## 2021-06-11 DIAGNOSIS — Z1331 Encounter for screening for depression: Secondary | ICD-10-CM | POA: Diagnosis not present

## 2021-06-11 DIAGNOSIS — I129 Hypertensive chronic kidney disease with stage 1 through stage 4 chronic kidney disease, or unspecified chronic kidney disease: Secondary | ICD-10-CM | POA: Diagnosis not present

## 2021-06-11 DIAGNOSIS — N182 Chronic kidney disease, stage 2 (mild): Secondary | ICD-10-CM | POA: Diagnosis not present

## 2021-06-11 DIAGNOSIS — F325 Major depressive disorder, single episode, in full remission: Secondary | ICD-10-CM | POA: Diagnosis not present

## 2021-07-06 DIAGNOSIS — R519 Headache, unspecified: Secondary | ICD-10-CM | POA: Diagnosis not present

## 2021-07-06 DIAGNOSIS — M8580 Other specified disorders of bone density and structure, unspecified site: Secondary | ICD-10-CM | POA: Diagnosis not present

## 2021-07-09 DIAGNOSIS — Z23 Encounter for immunization: Secondary | ICD-10-CM | POA: Diagnosis not present

## 2021-07-22 ENCOUNTER — Other Ambulatory Visit: Payer: Self-pay

## 2021-07-22 ENCOUNTER — Encounter
Payer: Medicare Other | Attending: Physical Medicine and Rehabilitation | Admitting: Physical Medicine and Rehabilitation

## 2021-07-22 ENCOUNTER — Encounter: Payer: Self-pay | Admitting: Physical Medicine and Rehabilitation

## 2021-07-22 VITALS — BP 133/75 | HR 66 | Temp 98.2°F | Ht 65.0 in | Wt 141.0 lb

## 2021-07-22 DIAGNOSIS — G894 Chronic pain syndrome: Secondary | ICD-10-CM | POA: Diagnosis not present

## 2021-07-22 DIAGNOSIS — M5442 Lumbago with sciatica, left side: Secondary | ICD-10-CM | POA: Diagnosis not present

## 2021-07-22 DIAGNOSIS — G8929 Other chronic pain: Secondary | ICD-10-CM | POA: Diagnosis not present

## 2021-07-22 DIAGNOSIS — M5116 Intervertebral disc disorders with radiculopathy, lumbar region: Secondary | ICD-10-CM | POA: Diagnosis not present

## 2021-07-22 DIAGNOSIS — M7918 Myalgia, other site: Secondary | ICD-10-CM | POA: Diagnosis not present

## 2021-07-22 MED ORDER — DULOXETINE HCL 60 MG PO CPEP: 60.0000 mg | ORAL_CAPSULE | Freq: Every day | ORAL | 3 refills | Status: DC

## 2021-07-22 NOTE — Progress Notes (Signed)
Patient is a 75 yr old female with lumbar spinal stenosis, CKD Stage III, and migraines here for low back pain  with radiculopathy.  Here for f/u on LBP with radic.  And to do trigger point injections.    Fell in bathroom 5-6 weeks ago.  Only fall.  Hit LUE- and head.  Every evening- pain in L arm In lateral aspect of L middle arm that urts.    Also had a place in L mid back that was hurting- all day- very painful.    Hasn't had any resp illness so far this year.  Got flu shot; was asking about COVID booster.   Did get theracane- has been using it-  That helps some.  Uses daily most times.   Is sore in posterior neck- and uses knobs on theracane and likes it.    Hasn't gotten back into walking- at least 40 minutes/day- daily- but has to start back- spring was last time.     Plan: Suggest getting new Omicron COVID booster when possible.    2.  With theracane, hold pressure at least 2 minutes on each spot! Don't massage- just hold pressure.    3. Patient here for trigger point injections for  Consent done and on chart.  Cleaned areas with alcohol and injected using a 27 gauge 1.5 inch needle  Injected 6cc Using 1% Lidocaine with no EPI  Upper traps B/L - L x2 and supraspinatus Levators B/L  Posterior scalenes B/L Middle scalenes Splenius Capitus B/L Pectoralis Major Rhomboids B/L  Infraspinatus Teres Major/minor Thoracic paraspinals Lumbar paraspinals- B/L and 1 extra on L Other injections- L biceps-    Patient's level of pain prior was  6-7/10 Current level of pain after injections is- has more ROM in neck- and pain gone in L biceps resolved and pain 1-2/10- "next to nothing".   There was no bleeding or complications.  Patient was advised to drink a lot of water on day after injections to flush system Will have increased soreness for 12-48 hours after injections.  Can use Lidocaine patches the day AFTER injections Can use theracane on day of injections  in places didn't inject Can use heating pad 4-6 hours AFTER injections  4. Will renew Duloxetine 60 mg daily- but 90 days supply- with 3 refills.    5. F/U in 2-3 months- call if need trigger point injections prior to then.   I spent a total of 15 minutes on visit- outside trigger point injections.

## 2021-07-22 NOTE — Patient Instructions (Signed)
Plan: Suggest getting new Omicron COVID booster when possible.    2.  With theracane, hold pressure at least 2 minutes on each spot! Don't massage- just hold pressure.    3. Patient here for trigger point injections for  Consent done and on chart.  Cleaned areas with alcohol and injected using a 27 gauge 1.5 inch needle  Injected 6cc Using 1% Lidocaine with no EPI  Upper traps B/L - L x2 and supraspinatus Levators B/L  Posterior scalenes B/L Middle scalenes Splenius Capitus B/L Pectoralis Major Rhomboids B/L  Infraspinatus Teres Major/minor Thoracic paraspinals Lumbar paraspinals- B/L and 1 extra on L Other injections- L biceps-    Patient's level of pain prior was  6-7/10 Current level of pain after injections is- has more ROM in neck- and pain gone in L biceps resolved and pain 1-2/10- "next to nothing".   There was no bleeding or complications.  Patient was advised to drink a lot of water on day after injections to flush system Will have increased soreness for 12-48 hours after injections.  Can use Lidocaine patches the day AFTER injections Can use theracane on day of injections in places didn't inject Can use heating pad 4-6 hours AFTER injections  4. Will renew Duloxetine 60 mg daily- but 90 days supply- with 3 refills.    5. F/U in 2-3 months- call if need trigger point injections prior to then.

## 2021-07-31 DIAGNOSIS — L820 Inflamed seborrheic keratosis: Secondary | ICD-10-CM | POA: Diagnosis not present

## 2021-07-31 DIAGNOSIS — L728 Other follicular cysts of the skin and subcutaneous tissue: Secondary | ICD-10-CM | POA: Diagnosis not present

## 2021-07-31 DIAGNOSIS — L814 Other melanin hyperpigmentation: Secondary | ICD-10-CM | POA: Diagnosis not present

## 2021-07-31 DIAGNOSIS — L821 Other seborrheic keratosis: Secondary | ICD-10-CM | POA: Diagnosis not present

## 2021-07-31 DIAGNOSIS — D485 Neoplasm of uncertain behavior of skin: Secondary | ICD-10-CM | POA: Diagnosis not present

## 2021-07-31 DIAGNOSIS — C44529 Squamous cell carcinoma of skin of other part of trunk: Secondary | ICD-10-CM | POA: Diagnosis not present

## 2021-08-10 ENCOUNTER — Other Ambulatory Visit: Payer: Self-pay | Admitting: Physical Medicine and Rehabilitation

## 2021-09-01 IMAGING — CR DG LUMBAR SPINE 2-3V
3 series · 3 of 3 positions shown · non-contrast
Comparison: Lumbar radiograph 08/10/2016.  Lumbar MRI 08/13/2016

CLINICAL DATA: Chronic low back pain for 4+ years. No red flags. No
known injury. Chronic left-sided low back pain with left-sided
sciatica.

EXAM:
LUMBAR SPINE - 2-3 VIEW

[t lumbar spine ap]
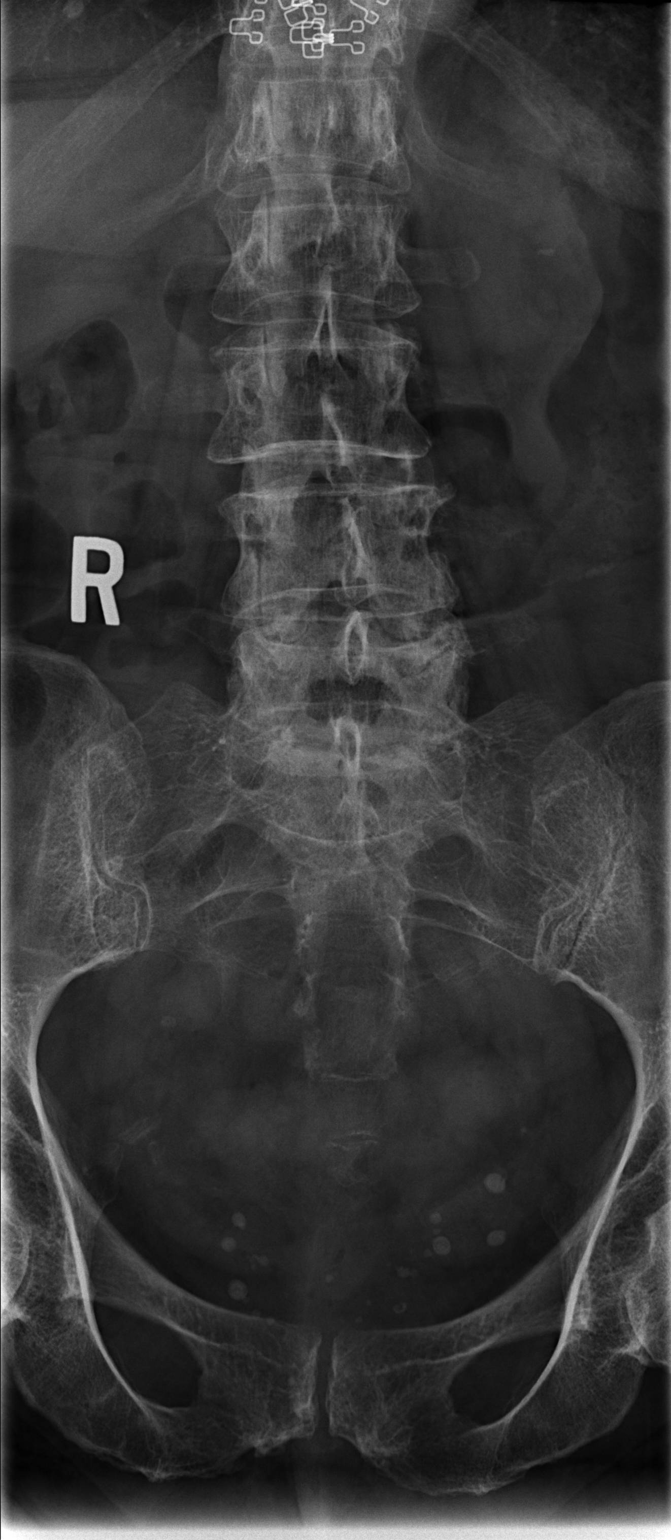

[t lumbar spine lat]
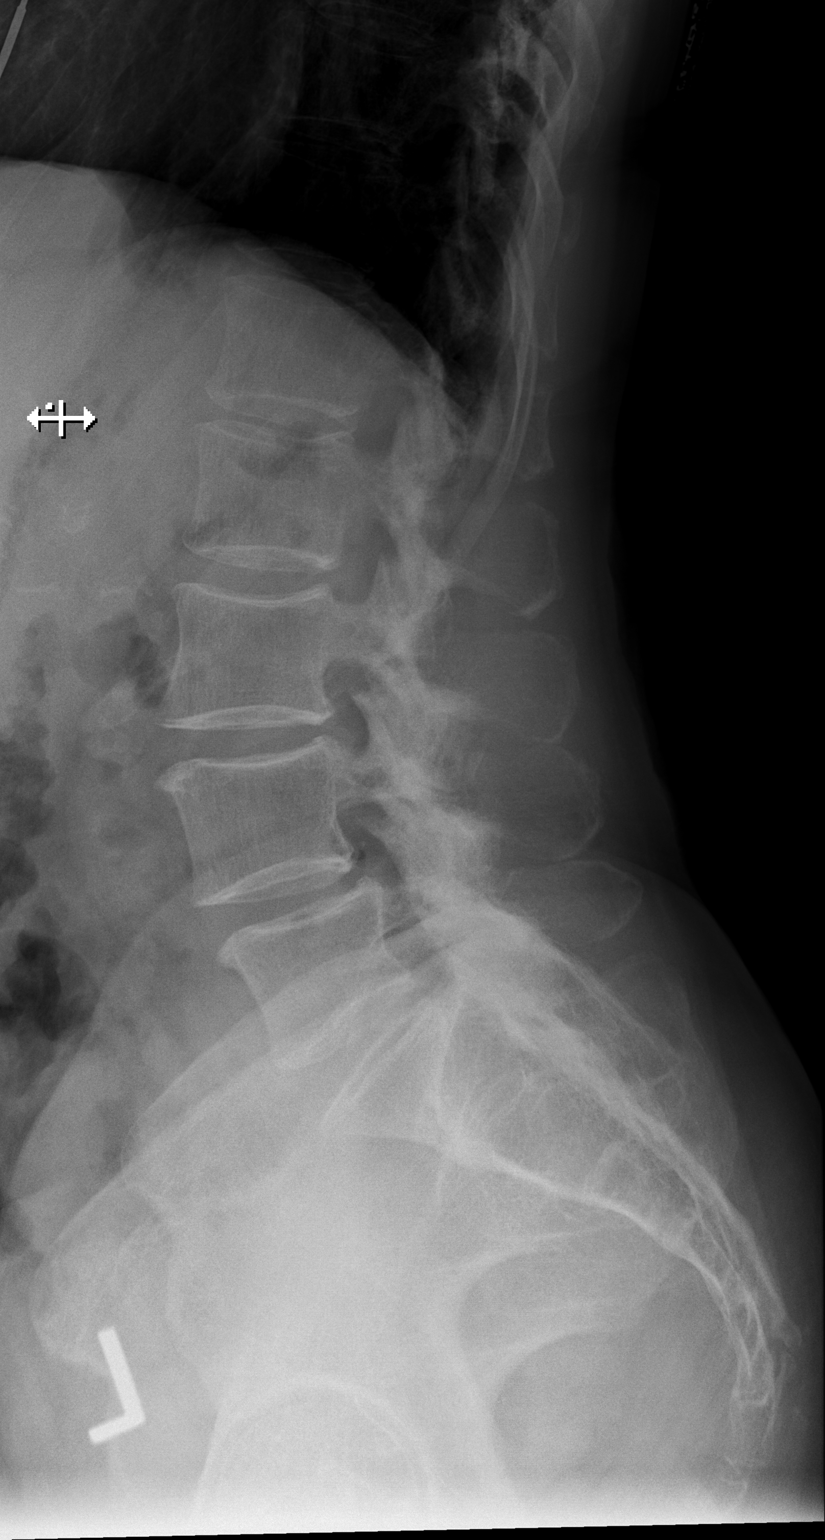

[t lumbar l-5 s-1 spot]
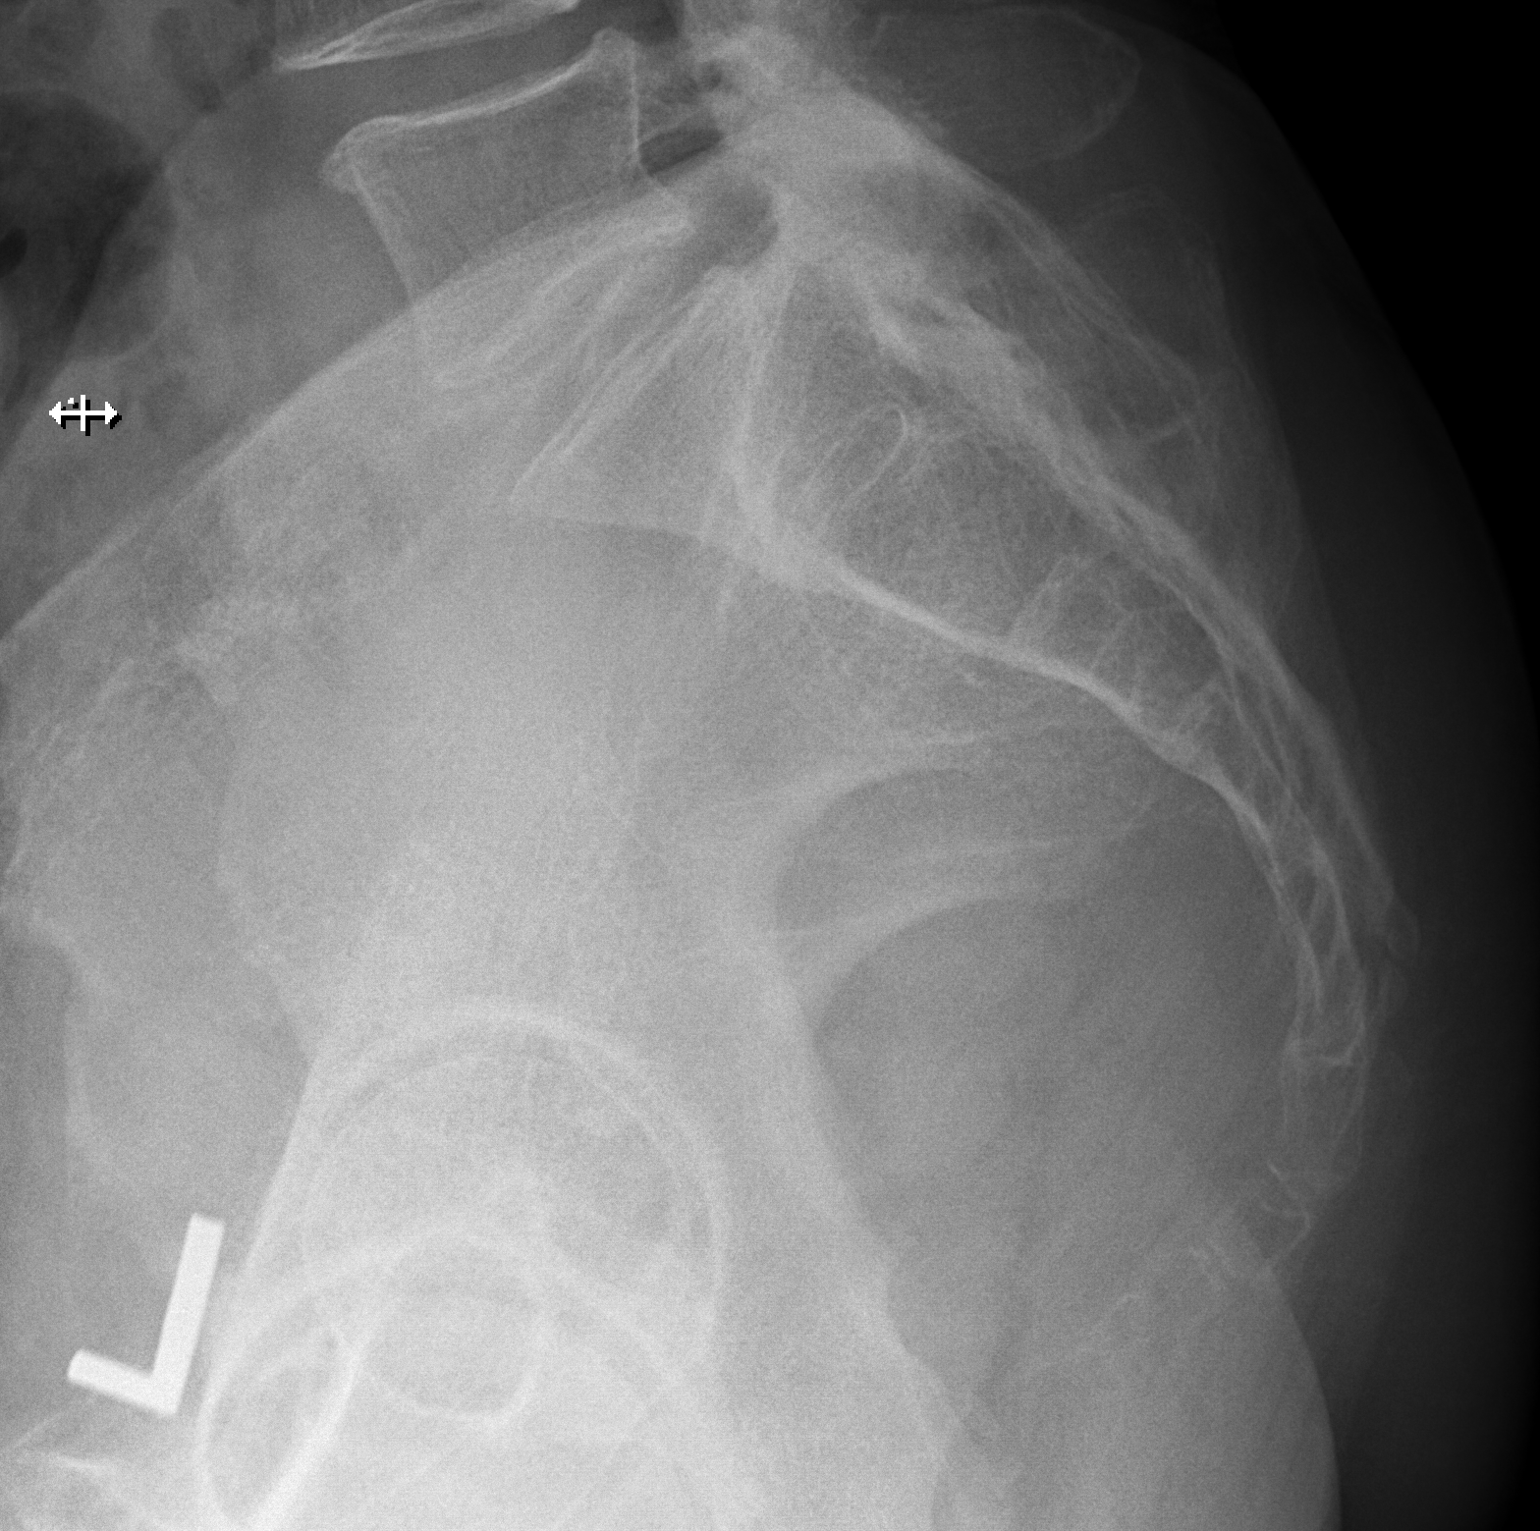

[3 of 3 positions shown; findings below may reference images not displayed]

FINDINGS: There are 4 non-rib-bearing lumbar vertebra, diminutive ribs at L1.
Lower most non-rib-bearing vertebra will be labeled L5. 2 mm
anterolisthesis of L4 on L5 with slight progression from prior
radiograph. Facet hypertrophy at L4-L5, as well as L5-S1 and L3-L4.
Mild multilevel endplate spurring with mild disc space narrowing at
L3-L4. Vertebral body heights are preserved. No fracture, evidence
of focal lesion or bony destruction. The sacroiliac joints are
congruent.
IMPRESSION: Degenerative change in the lumbar spine with primarily facet
hypertrophy. Slight increase in anterolisthesis of L4 on L5 from

## 2021-10-07 ENCOUNTER — Encounter
Payer: Medicare Other | Attending: Physical Medicine and Rehabilitation | Admitting: Physical Medicine and Rehabilitation

## 2021-10-07 ENCOUNTER — Encounter: Payer: Self-pay | Admitting: Physical Medicine and Rehabilitation

## 2021-10-07 ENCOUNTER — Other Ambulatory Visit: Payer: Self-pay

## 2021-10-07 VITALS — BP 156/81 | HR 58 | Ht 65.0 in | Wt 142.8 lb

## 2021-10-07 DIAGNOSIS — G894 Chronic pain syndrome: Secondary | ICD-10-CM | POA: Insufficient documentation

## 2021-10-07 DIAGNOSIS — M5116 Intervertebral disc disorders with radiculopathy, lumbar region: Secondary | ICD-10-CM | POA: Insufficient documentation

## 2021-10-07 DIAGNOSIS — M7918 Myalgia, other site: Secondary | ICD-10-CM | POA: Diagnosis not present

## 2021-10-07 NOTE — Progress Notes (Signed)
Patient is a 76yr old female with lumbar spinal stenosis, CKD Stage III, and migraines here for low back pain  with radiculopathy.  Here for f/u on LBP with radic.  And to do trigger point injections.     Trigger point injections went well last time- Lasts ~ 1 month or so.   Pain doing OK- overall.  Pain going down leg is much better.  Also L bicep pain has acted up again and needs the Trp injection done.   Has 1 year supply at last visit on Cymbalta.     Plan: Patient here for trigger point injections for myofascial pain  Consent done and on chart.  Cleaned areas with alcohol and injected using a 27 gauge 1.5 inch needle  Injected 6cc Using 1% Lidocaine with no EPI  Upper traps B/L  Levators- B/L  Posterior scalenes Middle scalenes- B/L  Splenius Capitus- B/L  Pectoralis Major- B/L  Rhomboids- B/L x2 Infraspinatus Teres Major/minor Thoracic paraspinals Lumbar paraspinals- B/L x2 Other injections- L bicep  Patient's level of pain prior was 8-8.5/10 Current level of pain after injections is-relaxed now- and can turn neck and doesn't hurt  There was no bleeding or complications.  Patient was advised to drink a lot of water on day after injections to flush system Will have increased soreness for 12-48 hours after injections.  Can use Lidocaine patches the day AFTER injections Can use theracane on day of injections in places didn't inject Can use heating pad 4-6 hours AFTER injections  2. Continue Theracane and Duloxetine.   3. F/U in 3 months- for TrP injections and f/u.

## 2021-10-07 NOTE — Patient Instructions (Signed)
Plan: Patient here for trigger point injections for myofascial pain  Consent done and on chart.  Cleaned areas with alcohol and injected using a 27 gauge 1.5 inch needle  Injected 6cc Using 1% Lidocaine with no EPI  Upper traps B/L  Levators- B/L  Posterior scalenes Middle scalenes- B/L  Splenius Capitus- B/L  Pectoralis Major- B/L  Rhomboids- B/L x2 Infraspinatus Teres Major/minor Thoracic paraspinals Lumbar paraspinals- B/L x2 Other injections- L bicep  Patient's level of pain prior was 8-8.5/10 Current level of pain after injections is-relaxed now- and can turn neck and doesn't hurt  There was no bleeding or complications.  Patient was advised to drink a lot of water on day after injections to flush system Will have increased soreness for 12-48 hours after injections.  Can use Lidocaine patches the day AFTER injections Can use theracane on day of injections in places didn't inject Can use heating pad 4-6 hours AFTER injections  2. Continue Theracane and Duloxetine.   3. F/U in 3 months- for TrP injections and f/u.

## 2021-10-19 DIAGNOSIS — H401122 Primary open-angle glaucoma, left eye, moderate stage: Secondary | ICD-10-CM | POA: Diagnosis not present

## 2021-10-30 DIAGNOSIS — D225 Melanocytic nevi of trunk: Secondary | ICD-10-CM | POA: Diagnosis not present

## 2021-10-30 DIAGNOSIS — L820 Inflamed seborrheic keratosis: Secondary | ICD-10-CM | POA: Diagnosis not present

## 2021-10-30 DIAGNOSIS — L728 Other follicular cysts of the skin and subcutaneous tissue: Secondary | ICD-10-CM | POA: Diagnosis not present

## 2021-10-30 DIAGNOSIS — D485 Neoplasm of uncertain behavior of skin: Secondary | ICD-10-CM | POA: Diagnosis not present

## 2021-10-30 DIAGNOSIS — D2239 Melanocytic nevi of other parts of face: Secondary | ICD-10-CM | POA: Diagnosis not present

## 2021-11-16 DIAGNOSIS — H401122 Primary open-angle glaucoma, left eye, moderate stage: Secondary | ICD-10-CM | POA: Diagnosis not present

## 2021-12-02 ENCOUNTER — Other Ambulatory Visit: Payer: Self-pay | Admitting: Hematology and Oncology

## 2021-12-02 DIAGNOSIS — K5900 Constipation, unspecified: Secondary | ICD-10-CM | POA: Diagnosis not present

## 2021-12-02 DIAGNOSIS — Z008 Encounter for other general examination: Secondary | ICD-10-CM | POA: Diagnosis not present

## 2021-12-02 DIAGNOSIS — R69 Illness, unspecified: Secondary | ICD-10-CM | POA: Diagnosis not present

## 2021-12-02 DIAGNOSIS — R42 Dizziness and giddiness: Secondary | ICD-10-CM | POA: Diagnosis not present

## 2021-12-02 DIAGNOSIS — Z885 Allergy status to narcotic agent status: Secondary | ICD-10-CM | POA: Diagnosis not present

## 2021-12-02 DIAGNOSIS — I10 Essential (primary) hypertension: Secondary | ICD-10-CM | POA: Diagnosis not present

## 2021-12-02 DIAGNOSIS — B009 Herpesviral infection, unspecified: Secondary | ICD-10-CM | POA: Diagnosis not present

## 2021-12-02 DIAGNOSIS — Z809 Family history of malignant neoplasm, unspecified: Secondary | ICD-10-CM | POA: Diagnosis not present

## 2021-12-02 DIAGNOSIS — E785 Hyperlipidemia, unspecified: Secondary | ICD-10-CM | POA: Diagnosis not present

## 2021-12-02 DIAGNOSIS — K219 Gastro-esophageal reflux disease without esophagitis: Secondary | ICD-10-CM | POA: Diagnosis not present

## 2021-12-02 DIAGNOSIS — I739 Peripheral vascular disease, unspecified: Secondary | ICD-10-CM | POA: Diagnosis not present

## 2021-12-02 DIAGNOSIS — I499 Cardiac arrhythmia, unspecified: Secondary | ICD-10-CM | POA: Diagnosis not present

## 2021-12-02 DIAGNOSIS — Z85828 Personal history of other malignant neoplasm of skin: Secondary | ICD-10-CM | POA: Diagnosis not present

## 2021-12-04 DIAGNOSIS — E785 Hyperlipidemia, unspecified: Secondary | ICD-10-CM | POA: Diagnosis not present

## 2021-12-04 DIAGNOSIS — R5383 Other fatigue: Secondary | ICD-10-CM | POA: Diagnosis not present

## 2021-12-15 DIAGNOSIS — E785 Hyperlipidemia, unspecified: Secondary | ICD-10-CM | POA: Diagnosis not present

## 2021-12-15 DIAGNOSIS — R69 Illness, unspecified: Secondary | ICD-10-CM | POA: Diagnosis not present

## 2021-12-15 DIAGNOSIS — I129 Hypertensive chronic kidney disease with stage 1 through stage 4 chronic kidney disease, or unspecified chronic kidney disease: Secondary | ICD-10-CM | POA: Diagnosis not present

## 2021-12-15 DIAGNOSIS — N182 Chronic kidney disease, stage 2 (mild): Secondary | ICD-10-CM | POA: Diagnosis not present

## 2021-12-22 DIAGNOSIS — M545 Low back pain, unspecified: Secondary | ICD-10-CM | POA: Diagnosis not present

## 2021-12-22 DIAGNOSIS — M5432 Sciatica, left side: Secondary | ICD-10-CM | POA: Diagnosis not present

## 2021-12-22 DIAGNOSIS — R531 Weakness: Secondary | ICD-10-CM | POA: Diagnosis not present

## 2021-12-22 DIAGNOSIS — M2560 Stiffness of unspecified joint, not elsewhere classified: Secondary | ICD-10-CM | POA: Diagnosis not present

## 2021-12-25 DIAGNOSIS — M5432 Sciatica, left side: Secondary | ICD-10-CM | POA: Diagnosis not present

## 2021-12-25 DIAGNOSIS — M545 Low back pain, unspecified: Secondary | ICD-10-CM | POA: Diagnosis not present

## 2021-12-25 DIAGNOSIS — M2560 Stiffness of unspecified joint, not elsewhere classified: Secondary | ICD-10-CM | POA: Diagnosis not present

## 2021-12-25 DIAGNOSIS — R531 Weakness: Secondary | ICD-10-CM | POA: Diagnosis not present

## 2021-12-29 DIAGNOSIS — M5432 Sciatica, left side: Secondary | ICD-10-CM | POA: Diagnosis not present

## 2021-12-29 DIAGNOSIS — R531 Weakness: Secondary | ICD-10-CM | POA: Diagnosis not present

## 2021-12-29 DIAGNOSIS — M545 Low back pain, unspecified: Secondary | ICD-10-CM | POA: Diagnosis not present

## 2021-12-29 DIAGNOSIS — M2560 Stiffness of unspecified joint, not elsewhere classified: Secondary | ICD-10-CM | POA: Diagnosis not present

## 2022-01-01 ENCOUNTER — Encounter: Payer: No Typology Code available for payment source | Admitting: Physical Medicine and Rehabilitation

## 2022-01-04 DIAGNOSIS — M545 Low back pain, unspecified: Secondary | ICD-10-CM | POA: Diagnosis not present

## 2022-01-04 DIAGNOSIS — R531 Weakness: Secondary | ICD-10-CM | POA: Diagnosis not present

## 2022-01-04 DIAGNOSIS — M5432 Sciatica, left side: Secondary | ICD-10-CM | POA: Diagnosis not present

## 2022-01-04 DIAGNOSIS — M2560 Stiffness of unspecified joint, not elsewhere classified: Secondary | ICD-10-CM | POA: Diagnosis not present

## 2022-01-06 DIAGNOSIS — R531 Weakness: Secondary | ICD-10-CM | POA: Diagnosis not present

## 2022-01-06 DIAGNOSIS — M545 Low back pain, unspecified: Secondary | ICD-10-CM | POA: Diagnosis not present

## 2022-01-06 DIAGNOSIS — M2560 Stiffness of unspecified joint, not elsewhere classified: Secondary | ICD-10-CM | POA: Diagnosis not present

## 2022-01-06 DIAGNOSIS — M5432 Sciatica, left side: Secondary | ICD-10-CM | POA: Diagnosis not present

## 2022-01-11 DIAGNOSIS — R531 Weakness: Secondary | ICD-10-CM | POA: Diagnosis not present

## 2022-01-11 DIAGNOSIS — M5432 Sciatica, left side: Secondary | ICD-10-CM | POA: Diagnosis not present

## 2022-01-11 DIAGNOSIS — M545 Low back pain, unspecified: Secondary | ICD-10-CM | POA: Diagnosis not present

## 2022-01-11 DIAGNOSIS — M2560 Stiffness of unspecified joint, not elsewhere classified: Secondary | ICD-10-CM | POA: Diagnosis not present

## 2022-01-14 DIAGNOSIS — R531 Weakness: Secondary | ICD-10-CM | POA: Diagnosis not present

## 2022-01-14 DIAGNOSIS — M5432 Sciatica, left side: Secondary | ICD-10-CM | POA: Diagnosis not present

## 2022-01-14 DIAGNOSIS — M545 Low back pain, unspecified: Secondary | ICD-10-CM | POA: Diagnosis not present

## 2022-01-14 DIAGNOSIS — M2560 Stiffness of unspecified joint, not elsewhere classified: Secondary | ICD-10-CM | POA: Diagnosis not present

## 2022-01-19 DIAGNOSIS — R0989 Other specified symptoms and signs involving the circulatory and respiratory systems: Secondary | ICD-10-CM | POA: Diagnosis not present

## 2022-01-19 DIAGNOSIS — Z6822 Body mass index (BMI) 22.0-22.9, adult: Secondary | ICD-10-CM | POA: Diagnosis not present

## 2022-01-25 DIAGNOSIS — M5432 Sciatica, left side: Secondary | ICD-10-CM | POA: Diagnosis not present

## 2022-01-25 DIAGNOSIS — M545 Low back pain, unspecified: Secondary | ICD-10-CM | POA: Diagnosis not present

## 2022-01-25 DIAGNOSIS — M2560 Stiffness of unspecified joint, not elsewhere classified: Secondary | ICD-10-CM | POA: Diagnosis not present

## 2022-01-25 DIAGNOSIS — R531 Weakness: Secondary | ICD-10-CM | POA: Diagnosis not present

## 2022-01-28 DIAGNOSIS — R1310 Dysphagia, unspecified: Secondary | ICD-10-CM | POA: Diagnosis not present

## 2022-01-28 DIAGNOSIS — R1032 Left lower quadrant pain: Secondary | ICD-10-CM | POA: Diagnosis not present

## 2022-02-15 DEATH — deceased

## 2022-02-17 DIAGNOSIS — R1310 Dysphagia, unspecified: Secondary | ICD-10-CM | POA: Diagnosis not present

## 2022-04-14 ENCOUNTER — Encounter: Payer: Self-pay | Admitting: Physical Medicine and Rehabilitation

## 2022-04-14 ENCOUNTER — Encounter: Payer: Medicare HMO | Attending: Physical Medicine and Rehabilitation | Admitting: Physical Medicine and Rehabilitation

## 2022-04-14 VITALS — BP 150/79 | HR 56 | Ht 65.0 in | Wt 142.4 lb

## 2022-04-14 DIAGNOSIS — M549 Dorsalgia, unspecified: Secondary | ICD-10-CM | POA: Insufficient documentation

## 2022-04-14 DIAGNOSIS — M5116 Intervertebral disc disorders with radiculopathy, lumbar region: Secondary | ICD-10-CM

## 2022-04-14 DIAGNOSIS — M7918 Myalgia, other site: Secondary | ICD-10-CM | POA: Diagnosis not present

## 2022-04-14 DIAGNOSIS — N183 Chronic kidney disease, stage 3 unspecified: Secondary | ICD-10-CM | POA: Diagnosis not present

## 2022-04-14 DIAGNOSIS — M48061 Spinal stenosis, lumbar region without neurogenic claudication: Secondary | ICD-10-CM | POA: Insufficient documentation

## 2022-04-14 DIAGNOSIS — G43909 Migraine, unspecified, not intractable, without status migrainosus: Secondary | ICD-10-CM | POA: Diagnosis not present

## 2022-04-14 DIAGNOSIS — M542 Cervicalgia: Secondary | ICD-10-CM | POA: Insufficient documentation

## 2022-04-14 DIAGNOSIS — G894 Chronic pain syndrome: Secondary | ICD-10-CM

## 2022-04-14 MED ORDER — TRAMADOL HCL 50 MG PO TABS: 50.0000 mg | ORAL_TABLET | Freq: Four times a day (QID) | ORAL | 0 refills | Status: DC | PRN

## 2022-04-14 NOTE — Patient Instructions (Signed)
Plan: I like the lidocaine patches better- for back and neck pain- wears 12 hours then off for 12 hours- is over the counter now.   2.   Will try Tramadol- and if helpful, then will prescribe regularly- will give 7 days Rx- and if it works, will THEN get Opiate contract and UDS.   50 mg /1 tab up to 2x/day as needed- Will send in 28 pills for now- and if tolerates, doesn't cause nausea-    3. Patient here for trigger point injections for secondary myofascial pain  Consent done and on chart.  Cleaned areas with alcohol and injected using a 27 gauge 1.5 inch needle  Injected 5cc Using 1% Lidocaine with no EPI  Upper traps B/L x3 on L 2 on R Levators- B/ L Posterior scalenes Middle scalenes- B/L Splenius Capitus Pectoralis Major Rhomboids- B/L x2 on L Infraspinatus Teres Major/minor Thoracic paraspinals Lumbar paraspinals- B/L x2 Other injections-    Patient's level of pain prior was 8/10 Current level of pain after injections is- pain is better- about gone right now  There was no bleeding or complications.  Patient was advised to drink a lot of water on day after injections to flush system Will have increased soreness for 12-48 hours after injections.  Can use Lidocaine patches the day AFTER injections Can use theracane on day of injections in places didn't inject Can use heating pad 4-6 hours AFTER injections   4. Educate don tramadol- it's an opiate- and in rare cases can cause addiction- so take only the dose you need. Will try it and see how things go.    5. F/U in 23month for Trp injections and f/u on pain.

## 2022-04-14 NOTE — Progress Notes (Signed)
Patient is a 76yrold female with lumbar spinal stenosis, CKD Stage III, and migraines here for low back pain  with radiculopathy.  Here for f/u on LBP with radic.  And to do trigger point injections.     Pain been acting up some.    Was having sciatica pain down LLE and in back some as well.   Was doing arthritis creams and chiropractor- didn't help one bit.  Sent to PT- 6 weeks- helped tremendously.   L low back still hurting some and around neck-  Using salonpas patches and lidocaine roll on- not helping much.  Takes the edge off.    On  Duloxetine 60 mg daily- is helpful- said isn't taking Celexa anymore.  Off keppra - not sure if due to side effects or not working.  Made her feel sluggish and ill.    Pain she still has is dull constant aching pain.      Plan: I like the lidocaine patches better- for back and neck pain- wears 12 hours then off for 12 hours- is over the counter now.   2.   Will try Tramadol- and if helpful, then will prescribe regularly- will give 7 days Rx- and if it works, will THEN get Opiate contract and UDS.   50 mg /1 tab up to 2x/day as needed- Will send in 28 pills for now- and if tolerates, doesn't cause nausea-    3. Patient here for trigger point injections for secondary myofascial pain  Consent done and on chart.  Cleaned areas with alcohol and injected using a 27 gauge 1.5 inch needle  Injected 5cc Using 1% Lidocaine with no EPI  Upper traps B/L x3 on L 2 on R Levators- B/ L Posterior scalenes Middle scalenes- B/L Splenius Capitus Pectoralis Major Rhomboids- B/L x2 on L Infraspinatus Teres Major/minor Thoracic paraspinals Lumbar paraspinals- B/L x2 Other injections-    Patient's level of pain prior was 8/10 Current level of pain after injections is- pain is better- about gone right now  There was no bleeding or complications.  Patient was advised to drink a lot of water on day after injections to flush system Will have  increased soreness for 12-48 hours after injections.  Can use Lidocaine patches the day AFTER injections Can use theracane on day of injections in places didn't inject Can use heating pad 4-6 hours AFTER injections   4. Educate don tramadol- it's an opiate- and in rare cases can cause addiction- so take only the dose you need. Will try it and see how things go.    5. F/U in 384monthfor Trp injections and f/u on pain.

## 2022-04-16 DIAGNOSIS — H349 Unspecified retinal vascular occlusion: Secondary | ICD-10-CM | POA: Diagnosis not present

## 2022-04-16 DIAGNOSIS — H5213 Myopia, bilateral: Secondary | ICD-10-CM | POA: Diagnosis not present

## 2022-04-16 DIAGNOSIS — H401122 Primary open-angle glaucoma, left eye, moderate stage: Secondary | ICD-10-CM | POA: Diagnosis not present

## 2022-04-16 DIAGNOSIS — Z961 Presence of intraocular lens: Secondary | ICD-10-CM | POA: Diagnosis not present

## 2022-05-10 DIAGNOSIS — L570 Actinic keratosis: Secondary | ICD-10-CM | POA: Diagnosis not present

## 2022-05-10 DIAGNOSIS — D225 Melanocytic nevi of trunk: Secondary | ICD-10-CM | POA: Diagnosis not present

## 2022-05-10 DIAGNOSIS — D2239 Melanocytic nevi of other parts of face: Secondary | ICD-10-CM | POA: Diagnosis not present

## 2022-05-10 DIAGNOSIS — L814 Other melanin hyperpigmentation: Secondary | ICD-10-CM | POA: Diagnosis not present

## 2022-05-10 DIAGNOSIS — L820 Inflamed seborrheic keratosis: Secondary | ICD-10-CM | POA: Diagnosis not present

## 2022-05-13 DIAGNOSIS — R42 Dizziness and giddiness: Secondary | ICD-10-CM | POA: Diagnosis not present

## 2022-05-13 DIAGNOSIS — Z6822 Body mass index (BMI) 22.0-22.9, adult: Secondary | ICD-10-CM | POA: Diagnosis not present

## 2022-05-13 DIAGNOSIS — M8580 Other specified disorders of bone density and structure, unspecified site: Secondary | ICD-10-CM | POA: Diagnosis not present

## 2022-05-13 DIAGNOSIS — I951 Orthostatic hypotension: Secondary | ICD-10-CM | POA: Diagnosis not present

## 2022-06-01 DIAGNOSIS — I129 Hypertensive chronic kidney disease with stage 1 through stage 4 chronic kidney disease, or unspecified chronic kidney disease: Secondary | ICD-10-CM | POA: Diagnosis not present

## 2022-06-01 DIAGNOSIS — E785 Hyperlipidemia, unspecified: Secondary | ICD-10-CM | POA: Diagnosis not present

## 2022-06-08 DIAGNOSIS — Z23 Encounter for immunization: Secondary | ICD-10-CM | POA: Diagnosis not present

## 2022-06-08 DIAGNOSIS — Z6822 Body mass index (BMI) 22.0-22.9, adult: Secondary | ICD-10-CM | POA: Diagnosis not present

## 2022-06-08 DIAGNOSIS — Z139 Encounter for screening, unspecified: Secondary | ICD-10-CM | POA: Diagnosis not present

## 2022-06-08 DIAGNOSIS — N952 Postmenopausal atrophic vaginitis: Secondary | ICD-10-CM | POA: Diagnosis not present

## 2022-06-08 DIAGNOSIS — E785 Hyperlipidemia, unspecified: Secondary | ICD-10-CM | POA: Diagnosis not present

## 2022-06-08 DIAGNOSIS — Z1389 Encounter for screening for other disorder: Secondary | ICD-10-CM | POA: Diagnosis not present

## 2022-06-08 DIAGNOSIS — Z0000 Encounter for general adult medical examination without abnormal findings: Secondary | ICD-10-CM | POA: Diagnosis not present

## 2022-06-08 DIAGNOSIS — R300 Dysuria: Secondary | ICD-10-CM | POA: Diagnosis not present

## 2022-06-08 DIAGNOSIS — Z0189 Encounter for other specified special examinations: Secondary | ICD-10-CM | POA: Diagnosis not present

## 2022-06-08 DIAGNOSIS — Z1339 Encounter for screening examination for other mental health and behavioral disorders: Secondary | ICD-10-CM | POA: Diagnosis not present

## 2022-06-08 DIAGNOSIS — Z1331 Encounter for screening for depression: Secondary | ICD-10-CM | POA: Diagnosis not present

## 2022-06-08 DIAGNOSIS — Z136 Encounter for screening for cardiovascular disorders: Secondary | ICD-10-CM | POA: Diagnosis not present

## 2022-06-22 DIAGNOSIS — R1310 Dysphagia, unspecified: Secondary | ICD-10-CM | POA: Diagnosis not present

## 2022-06-22 DIAGNOSIS — Z8711 Personal history of peptic ulcer disease: Secondary | ICD-10-CM | POA: Diagnosis not present

## 2022-06-22 DIAGNOSIS — K589 Irritable bowel syndrome without diarrhea: Secondary | ICD-10-CM | POA: Diagnosis not present

## 2022-06-25 DIAGNOSIS — D225 Melanocytic nevi of trunk: Secondary | ICD-10-CM | POA: Diagnosis not present

## 2022-06-25 DIAGNOSIS — L570 Actinic keratosis: Secondary | ICD-10-CM | POA: Diagnosis not present

## 2022-06-25 DIAGNOSIS — L820 Inflamed seborrheic keratosis: Secondary | ICD-10-CM | POA: Diagnosis not present

## 2022-07-02 ENCOUNTER — Other Ambulatory Visit: Payer: Self-pay | Admitting: Physical Medicine and Rehabilitation

## 2022-07-16 DIAGNOSIS — H349 Unspecified retinal vascular occlusion: Secondary | ICD-10-CM | POA: Diagnosis not present

## 2022-07-16 DIAGNOSIS — H401131 Primary open-angle glaucoma, bilateral, mild stage: Secondary | ICD-10-CM | POA: Diagnosis not present

## 2022-07-16 DIAGNOSIS — H35351 Cystoid macular degeneration, right eye: Secondary | ICD-10-CM | POA: Diagnosis not present

## 2022-07-19 DIAGNOSIS — Z23 Encounter for immunization: Secondary | ICD-10-CM | POA: Diagnosis not present

## 2022-07-19 DIAGNOSIS — Z6822 Body mass index (BMI) 22.0-22.9, adult: Secondary | ICD-10-CM | POA: Diagnosis not present

## 2022-07-19 DIAGNOSIS — Z1211 Encounter for screening for malignant neoplasm of colon: Secondary | ICD-10-CM | POA: Diagnosis not present

## 2022-07-19 DIAGNOSIS — Z0000 Encounter for general adult medical examination without abnormal findings: Secondary | ICD-10-CM | POA: Diagnosis not present

## 2022-07-22 DIAGNOSIS — H401121 Primary open-angle glaucoma, left eye, mild stage: Secondary | ICD-10-CM | POA: Diagnosis not present

## 2022-08-04 ENCOUNTER — Encounter: Payer: Self-pay | Admitting: Physical Medicine and Rehabilitation

## 2022-08-04 ENCOUNTER — Encounter: Payer: Medicare HMO | Attending: Physical Medicine and Rehabilitation | Admitting: Physical Medicine and Rehabilitation

## 2022-08-04 VITALS — BP 146/88 | HR 60 | Ht 65.0 in | Wt 141.0 lb

## 2022-08-04 DIAGNOSIS — M7918 Myalgia, other site: Secondary | ICD-10-CM | POA: Diagnosis not present

## 2022-08-04 DIAGNOSIS — Z8249 Family history of ischemic heart disease and other diseases of the circulatory system: Secondary | ICD-10-CM | POA: Insufficient documentation

## 2022-08-04 DIAGNOSIS — I129 Hypertensive chronic kidney disease with stage 1 through stage 4 chronic kidney disease, or unspecified chronic kidney disease: Secondary | ICD-10-CM | POA: Insufficient documentation

## 2022-08-04 DIAGNOSIS — Z79899 Other long term (current) drug therapy: Secondary | ICD-10-CM | POA: Insufficient documentation

## 2022-08-04 DIAGNOSIS — M5416 Radiculopathy, lumbar region: Secondary | ICD-10-CM | POA: Diagnosis not present

## 2022-08-04 DIAGNOSIS — N183 Chronic kidney disease, stage 3 unspecified: Secondary | ICD-10-CM | POA: Insufficient documentation

## 2022-08-04 DIAGNOSIS — M48061 Spinal stenosis, lumbar region without neurogenic claudication: Secondary | ICD-10-CM | POA: Insufficient documentation

## 2022-08-04 MED ORDER — DULOXETINE HCL 60 MG PO CPEP: 120.0000 mg | ORAL_CAPSULE | Freq: Every day | ORAL | 3 refills | Status: DC

## 2022-08-04 MED ORDER — LIDOCAINE HCL 1 % IJ SOLN
6.0000 mL | Freq: Once | INTRAMUSCULAR | Status: AC
  Administered 2022-08-04: 6 mL

## 2022-08-04 NOTE — Progress Notes (Signed)
Subjective:    Patient ID: Alicia Arnold, female    DOB: [birth year], 76 y.o.   MRN: [id number]  HPI  Patient is a 76 yr old female with lumbar spinal stenosis, CKD Stage III, and migraines here for low back pain  with radiculopathy.  Here for f/u on LBP with radic.  And to do trigger point injections.     Tried Tramadol- didn't work  Mostly had side effects- made her nauseated/ill.   Tried the OTC lidocaine patches- they do help-   Wants trigger point injections-  Pain doing "OK"- L side medial side of shoulder hurting more than R side.   And had sciatic nerve pain on L side.   Wears a lumbar support- has to wear when walks a lot- like at [place]- does help.   Duloxetine helps a lot- for nerve pain and depression.   Trying to do more exercise- said "gaining weight"-      Pain Inventory Average Pain 5 Pain Right Now 5 My pain is dull  In the last 24 hours, has pain interfered with the following? General activity 0 Relation with others 0 Enjoyment of life 0 What TIME of day is your pain at its worst? evening Sleep (in general) Fair  Pain is worse with: bending Pain improves with: heat/ice Relief from Meds: 7  Family History  Problem Relation Age of Onset   Osteoporosis Mother    Hyperlipidemia Mother    CVA Father    Hypertension Father    Social History   Socioeconomic History   Marital status: Divorced    Spouse name: Not on file   Number of children: Not on file   Years of education: Not on file   Highest education level: Not on file  Occupational History   Not on file  Tobacco Use   Smoking status: Never   Smokeless tobacco: Never  Vaping Use   Vaping Use: Never used  Substance and Sexual Activity   Alcohol use: Yes    Comment: Socially   Drug use: Never   Sexual activity: Not on file  Other Topics Concern   Not on file  Social History Narrative   Right handed   One story home   No living will   One son   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)   Social Determinants of Radio broadcast assistant Strain: Not on file  Food Insecurity: Not on file  Transportation Needs: Not on file  Physical Activity: Not on file  Stress: Not on file  Social Connections: Not on file   Past Surgical History:  Procedure Laterality Date   carpal tunnel relase     CERVICAL LAMINECTOMY     Past Surgical History:  Procedure Laterality Date   carpal tunnel relase     CERVICAL LAMINECTOMY     Past Medical History:  Diagnosis Date   COPD (chronic obstructive pulmonary disease) (HCC)    Depression    Female bladder prolapse    HTN (hypertension)    Hyperlipidemia    Spinal stenosis of lumbar region with radiculopathy    Stage 3 chronic kidney disease (HCC)    BP (!) 146/88   Pulse 60   Ht '5\' 5"'$  (1.651 m)   Wt 141 lb (64 kg)   SpO2 98%   BMI 23.46 kg/m   Opioid Risk Score:   Fall Risk Score:  `1  Depression screen [facility] 2/9     08/04/2022  9:55 AM 10/07/2021   11:14 AM 07/22/2021   10:21 AM 06/20/2020   10:16 AM  Depression screen PHQ 2/9  Decreased Interest 0 0 0 3  Down, Depressed, Hopeless 0 0 0 0  PHQ - 2 Score 0 0 0 3  Altered sleeping    0  Tired, decreased energy    0  Change in appetite    0  Feeling bad or failure about yourself     0  Trouble concentrating    0  Moving slowly or fidgety/restless    0  Suicidal thoughts    0  PHQ-9 Score    3  Difficult doing work/chores    Not difficult at all      Review of Systems  Musculoskeletal:  Positive for neck pain.  All other systems reviewed and are negative.      Objective:   Physical Exam  Awake, alert, appropriate, sitting in chair, not on table due to pain, NAD Shoulders scrunched up towards neck Trigger points, upper traps, levators, scalenes, and splenius capitus as well as rhomboids and pecs and lumbar paraspinals.       Assessment & Plan:   Patient is a 76 yr old female with lumbar spinal stenosis, CKD Stage III, and  migraines here for low back pain  with radiculopathy.  Here for f/u on LBP with radic.  And to do trigger point injections.    Since still having sciatica/lumbar radiculopathy, will increase Duloxetine to 120 mg daily- can increase ot 2 pills then get refill when necessary- sent in new Rx.   2. Patient here for trigger point injections for  Consent done and on chart.  Cleaned areas with alcohol and injected using a 27 gauge 1.5 inch needle  Injected 6cc- no wastage Using 1% Lidocaine with no EPI  Upper traps- B/L - 3x on L; 1x on R Levators B/L  Posterior scalenes Middle scalenes- L only Splenius Capitus- L only Pectoralis Major-  Rhomboids-B/L x2 Infraspinatus Teres Major/minor Thoracic paraspinals B/L  Lumbar paraspinals- B/L  Other injections- L triceps- twitch response large in L upper trap and L triceps   Patient's level of pain prior was 5/10 Current level of pain after injections is- 0-1/10- is good  There was no bleeding or complications.  Patient was advised to drink a lot of water on day after injections to flush system Will have increased soreness for 12-48 hours after injections.  Can use Lidocaine patches the day AFTER injections Can use theracane on day of injections in places didn't inject Can use heating pad 4-6 hours AFTER injections     3. F/U in 3 months- for trigger point injections and pain f/u.    4. Try melatonin gummies and body cream- not lotion.

## 2022-08-04 NOTE — Patient Instructions (Signed)
Patient is a 76 yr old female with lumbar spinal stenosis, CKD Stage III, and migraines here for low back pain  with radiculopathy.  Here for f/u on LBP with radic.  And to do trigger point injections.    Since still having sciatica/lumbar radiculopathy, will increase Duloxetine to 120 mg daily- can increase ot 2 pills then get refill when necessary- sent in new Rx.   2. Patient here for trigger point injections for  Consent done and on chart.  Cleaned areas with alcohol and injected using a 27 gauge 1.5 inch needle  Injected 6cc- no wastage Using 1% Lidocaine with no EPI  Upper traps- B/L - 3x on L; 1x on R Levators B/L  Posterior scalenes Middle scalenes- L only Splenius Capitus- L only Pectoralis Major-  Rhomboids-B/L x2 Infraspinatus Teres Major/minor Thoracic paraspinals B/L  Lumbar paraspinals- B/L  Other injections- L triceps- twitch response large in L upper trap and L triceps   Patient's level of pain prior was 5/10 Current level of pain after injections is- 0-1/10- is good  There was no bleeding or complications.  Patient was advised to drink a lot of water on day after injections to flush system Will have increased soreness for 12-48 hours after injections.  Can use Lidocaine patches the day AFTER injections Can use theracane on day of injections in places didn't inject Can use heating pad 4-6 hours AFTER injections     3. F/U in 3 months- for trigger point injections and pain f/u.  4. Melatonin gummies- they seem to work faster- and body butter or cream, not lotion!

## 2022-08-13 DIAGNOSIS — Z1231 Encounter for screening mammogram for malignant neoplasm of breast: Secondary | ICD-10-CM | POA: Diagnosis not present

## 2022-08-13 DIAGNOSIS — E2839 Other primary ovarian failure: Secondary | ICD-10-CM | POA: Diagnosis not present

## 2022-08-13 DIAGNOSIS — M85852 Other specified disorders of bone density and structure, left thigh: Secondary | ICD-10-CM | POA: Diagnosis not present

## 2022-08-16 ENCOUNTER — Telehealth: Payer: Self-pay

## 2022-08-27 DIAGNOSIS — L820 Inflamed seborrheic keratosis: Secondary | ICD-10-CM | POA: Diagnosis not present

## 2022-08-27 DIAGNOSIS — D2239 Melanocytic nevi of other parts of face: Secondary | ICD-10-CM | POA: Diagnosis not present

## 2022-08-27 DIAGNOSIS — L299 Pruritus, unspecified: Secondary | ICD-10-CM | POA: Diagnosis not present

## 2022-08-27 DIAGNOSIS — L300 Nummular dermatitis: Secondary | ICD-10-CM | POA: Diagnosis not present

## 2022-08-27 DIAGNOSIS — D225 Melanocytic nevi of trunk: Secondary | ICD-10-CM | POA: Diagnosis not present

## 2022-09-02 DIAGNOSIS — H401131 Primary open-angle glaucoma, bilateral, mild stage: Secondary | ICD-10-CM | POA: Diagnosis not present

## 2022-10-01 DIAGNOSIS — J069 Acute upper respiratory infection, unspecified: Secondary | ICD-10-CM | POA: Diagnosis not present

## 2022-10-01 DIAGNOSIS — Z20822 Contact with and (suspected) exposure to covid-19: Secondary | ICD-10-CM | POA: Diagnosis not present

## 2022-10-22 DIAGNOSIS — H349 Unspecified retinal vascular occlusion: Secondary | ICD-10-CM | POA: Diagnosis not present

## 2022-10-27 DIAGNOSIS — I129 Hypertensive chronic kidney disease with stage 1 through stage 4 chronic kidney disease, or unspecified chronic kidney disease: Secondary | ICD-10-CM | POA: Diagnosis not present

## 2022-10-27 DIAGNOSIS — B009 Herpesviral infection, unspecified: Secondary | ICD-10-CM | POA: Diagnosis not present

## 2022-10-27 DIAGNOSIS — Z885 Allergy status to narcotic agent status: Secondary | ICD-10-CM | POA: Diagnosis not present

## 2022-10-27 DIAGNOSIS — N182 Chronic kidney disease, stage 2 (mild): Secondary | ICD-10-CM | POA: Diagnosis not present

## 2022-10-27 DIAGNOSIS — Z8249 Family history of ischemic heart disease and other diseases of the circulatory system: Secondary | ICD-10-CM | POA: Diagnosis not present

## 2022-10-27 DIAGNOSIS — E785 Hyperlipidemia, unspecified: Secondary | ICD-10-CM | POA: Diagnosis not present

## 2022-10-27 DIAGNOSIS — Z833 Family history of diabetes mellitus: Secondary | ICD-10-CM | POA: Diagnosis not present

## 2022-10-27 DIAGNOSIS — Z008 Encounter for other general examination: Secondary | ICD-10-CM | POA: Diagnosis not present

## 2022-10-27 DIAGNOSIS — Z87892 Personal history of anaphylaxis: Secondary | ICD-10-CM | POA: Diagnosis not present

## 2022-10-27 DIAGNOSIS — K581 Irritable bowel syndrome with constipation: Secondary | ICD-10-CM | POA: Diagnosis not present

## 2022-10-27 DIAGNOSIS — K219 Gastro-esophageal reflux disease without esophagitis: Secondary | ICD-10-CM | POA: Diagnosis not present

## 2022-10-27 DIAGNOSIS — Z886 Allergy status to analgesic agent status: Secondary | ICD-10-CM | POA: Diagnosis not present

## 2022-10-27 DIAGNOSIS — M1990 Unspecified osteoarthritis, unspecified site: Secondary | ICD-10-CM | POA: Diagnosis not present

## 2022-11-05 DIAGNOSIS — H349 Unspecified retinal vascular occlusion: Secondary | ICD-10-CM | POA: Diagnosis not present

## 2022-11-08 ENCOUNTER — Encounter: Payer: Medicare HMO | Admitting: Physical Medicine and Rehabilitation

## 2022-11-26 DIAGNOSIS — D225 Melanocytic nevi of trunk: Secondary | ICD-10-CM | POA: Diagnosis not present

## 2022-11-26 DIAGNOSIS — L820 Inflamed seborrheic keratosis: Secondary | ICD-10-CM | POA: Diagnosis not present

## 2022-11-26 DIAGNOSIS — D485 Neoplasm of uncertain behavior of skin: Secondary | ICD-10-CM | POA: Diagnosis not present

## 2022-11-26 DIAGNOSIS — D2239 Melanocytic nevi of other parts of face: Secondary | ICD-10-CM | POA: Diagnosis not present

## 2022-12-06 DIAGNOSIS — U071 COVID-19: Secondary | ICD-10-CM | POA: Diagnosis not present

## 2022-12-06 DIAGNOSIS — Z20822 Contact with and (suspected) exposure to covid-19: Secondary | ICD-10-CM | POA: Diagnosis not present

## 2022-12-06 DIAGNOSIS — R059 Cough, unspecified: Secondary | ICD-10-CM | POA: Diagnosis not present

## 2022-12-30 DIAGNOSIS — M722 Plantar fascial fibromatosis: Secondary | ICD-10-CM | POA: Diagnosis not present

## 2022-12-31 DIAGNOSIS — D0461 Carcinoma in situ of skin of right upper limb, including shoulder: Secondary | ICD-10-CM | POA: Diagnosis not present

## 2023-01-03 ENCOUNTER — Encounter: Payer: Self-pay | Admitting: Physical Medicine and Rehabilitation

## 2023-01-03 ENCOUNTER — Encounter: Payer: Medicare HMO | Attending: Physical Medicine and Rehabilitation | Admitting: Physical Medicine and Rehabilitation

## 2023-01-03 VITALS — BP 144/79 | HR 60 | Ht 65.0 in | Wt 145.0 lb

## 2023-01-03 DIAGNOSIS — G894 Chronic pain syndrome: Secondary | ICD-10-CM | POA: Diagnosis not present

## 2023-01-03 DIAGNOSIS — M7918 Myalgia, other site: Secondary | ICD-10-CM | POA: Diagnosis not present

## 2023-01-03 DIAGNOSIS — M5116 Intervertebral disc disorders with radiculopathy, lumbar region: Secondary | ICD-10-CM | POA: Insufficient documentation

## 2023-01-03 DIAGNOSIS — M722 Plantar fascial fibromatosis: Secondary | ICD-10-CM | POA: Diagnosis not present

## 2023-01-03 MED ORDER — LIDOCAINE HCL 1 % IJ SOLN: 6.0000 mL | Freq: Once | INTRAMUSCULAR | Status: AC

## 2023-01-03 NOTE — Patient Instructions (Signed)
Plan: Suggest looking for melatonin gummies- because they work faster and seem to work faster in my opinion than regular melatonin.    2. Con't to use theracane- suggest no more than 1x/day  3. Plantar fasciitis L>R foot- Suggest [employer] for dress shoes- have more support in the shoes if needs dress shoes.   4. Patient here for trigger point injections for  Consent done and on chart.  Cleaned areas with alcohol and injected using a 27 gauge 1.5 inch needle    Injected 6cc- no waste  Using 1% Lidocaine with no EPI  Upper traps B/L  Levators- B/L  Posterior scalenes Middle scalenes L only Splenius Capitus Pectoralis Major Rhomboids B/L x2 Infraspinatus Teres Major/minor Thoracic paraspinals- B/L x2 Lumbar paraspinals- B/L x2 Other injections- L triceps x2   Patient's level of pain prior was 7-8/10 Current level of pain after injections is 0/10  There was no bleeding or complications.  Patient was advised to drink a lot of water on day after injections to flush system Will have increased soreness for 12-48 hours after injections.  Can use Lidocaine patches the day AFTER injections Can use theracane on day of injections in places didn't inject Can use heating pad 4-6 hours AFTER injections  5. I like Miralax or Senna- 2 pills of Senna or 1 dose of Miralax- can take ~ noon and should work before or right when you wake up.   6. Concerned about weight- has gained 4 Lbs- but could just be needs to have BM- educated her about current weight is good and not concerned- asks a lot.   7. F/U in 3 months for TrP injections- and f/u on back pain  8. Con't Duloxetine- doesn't need refills

## 2023-01-03 NOTE — Progress Notes (Signed)
Patient is a 77 yr old female with lumbar spinal stenosis, CKD Stage III, and migraines here for low back pain  with radiculopathy.  Here for f/u on LBP with radic.  And to do trigger point injections.   Back been hurting some.  Thinks increase in Duloxetine helped at last visit.   Helped 20-25% back pain.   Pain is not interfering with things right now- uses arthritis cream- that also helps.   Had COVID 1 month ago-   Looked for melatonin gummies  Been taking a tylenol before bed- that seems to help.   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   Feet hurting and burning- was dx'd with plantar fasciitis- has to do rolling of foeet- and suggested some shoes- $145 for the shoes he recommended- are tennis shoes. Wearing flats- wears a lot of time- like Financial trader shoes.  Also has Sketchers, but usually wears ballet slippers to "dress them up".     Plan: Suggest looking for melatonin gummies- because they work faster and seem to work faster in my opinion than regular melatonin.    2. Con't to use theracane- suggest no more than 1x/day  3. Plantar fasciitis L>R foot- Suggest [employer] for dress shoes- have more support in the shoes if needs dress shoes.   4. Patient here for trigger point injections for  Consent done and on chart.  Cleaned areas with alcohol and injected using a 27 gauge 1.5 inch needle    Injected 6cc- no waste  Using 1% Lidocaine with no EPI  Upper traps B/L  Levators- B/L  Posterior scalenes Middle scalenes L only Splenius Capitus Pectoralis Major Rhomboids B/L x2 Infraspinatus Teres Major/minor Thoracic paraspinals- B/L x2 Lumbar paraspinals- B/L x2 Other injections- L triceps x2   Patient's level of pain prior was 7-8/10 Current level of pain after injections is 0/10  There was no bleeding or  complications.  Patient was advised to drink a lot of water on day after injections to flush system Will have increased soreness for 12-48 hours after injections.  Can use Lidocaine patches the day AFTER injections Can use theracane on day of injections in places didn't inject Can use heating pad 4-6 hours AFTER injections  5. I like Miralax or Senna- 2 pills of Senna or 1 dose of Miralax- can take ~ noon and should work before or right when you wake up.   6. Concerned about weight- has gained 4 Lbs- but could just be needs to have BM- educated her about current weight is good and not concerned- asks a lot.   7. F/U in 3 months for TrP injections- and f/u on back pain  8. Con't Duloxetine- doesn't need refills   I spent a total of  28  minutes on total care today- >50% coordination of care- due to 8 minutes on injections- otherwise 20 minutes discussing other issues as detailed above

## 2023-01-10 DIAGNOSIS — E785 Hyperlipidemia, unspecified: Secondary | ICD-10-CM | POA: Diagnosis not present

## 2023-01-10 DIAGNOSIS — I129 Hypertensive chronic kidney disease with stage 1 through stage 4 chronic kidney disease, or unspecified chronic kidney disease: Secondary | ICD-10-CM | POA: Diagnosis not present

## 2023-01-12 DIAGNOSIS — Z6822 Body mass index (BMI) 22.0-22.9, adult: Secondary | ICD-10-CM | POA: Diagnosis not present

## 2023-01-12 DIAGNOSIS — J069 Acute upper respiratory infection, unspecified: Secondary | ICD-10-CM | POA: Diagnosis not present

## 2023-01-12 DIAGNOSIS — Z20822 Contact with and (suspected) exposure to covid-19: Secondary | ICD-10-CM | POA: Diagnosis not present

## 2023-01-18 DIAGNOSIS — E785 Hyperlipidemia, unspecified: Secondary | ICD-10-CM | POA: Diagnosis not present

## 2023-01-18 DIAGNOSIS — J449 Chronic obstructive pulmonary disease, unspecified: Secondary | ICD-10-CM | POA: Diagnosis not present

## 2023-01-18 DIAGNOSIS — I129 Hypertensive chronic kidney disease with stage 1 through stage 4 chronic kidney disease, or unspecified chronic kidney disease: Secondary | ICD-10-CM | POA: Diagnosis not present

## 2023-01-18 DIAGNOSIS — N182 Chronic kidney disease, stage 2 (mild): Secondary | ICD-10-CM | POA: Diagnosis not present

## 2023-01-18 DIAGNOSIS — Z6823 Body mass index (BMI) 23.0-23.9, adult: Secondary | ICD-10-CM | POA: Diagnosis not present

## 2023-02-24 DIAGNOSIS — M722 Plantar fascial fibromatosis: Secondary | ICD-10-CM | POA: Diagnosis not present

## 2023-03-03 DIAGNOSIS — H401131 Primary open-angle glaucoma, bilateral, mild stage: Secondary | ICD-10-CM | POA: Diagnosis not present

## 2023-03-10 DIAGNOSIS — H401111 Primary open-angle glaucoma, right eye, mild stage: Secondary | ICD-10-CM | POA: Diagnosis not present

## 2023-03-23 DIAGNOSIS — R42 Dizziness and giddiness: Secondary | ICD-10-CM | POA: Diagnosis not present

## 2023-03-23 DIAGNOSIS — Z6823 Body mass index (BMI) 23.0-23.9, adult: Secondary | ICD-10-CM | POA: Diagnosis not present

## 2023-03-29 DIAGNOSIS — M7062 Trochanteric bursitis, left hip: Secondary | ICD-10-CM | POA: Diagnosis not present

## 2023-03-29 DIAGNOSIS — M7918 Myalgia, other site: Secondary | ICD-10-CM | POA: Diagnosis not present

## 2023-03-29 DIAGNOSIS — M25552 Pain in left hip: Secondary | ICD-10-CM | POA: Diagnosis not present

## 2023-04-06 ENCOUNTER — Ambulatory Visit: Payer: No Typology Code available for payment source | Admitting: Physical Medicine and Rehabilitation

## 2023-04-20 DIAGNOSIS — H401131 Primary open-angle glaucoma, bilateral, mild stage: Secondary | ICD-10-CM | POA: Diagnosis not present

## 2023-04-23 DIAGNOSIS — R350 Frequency of micturition: Secondary | ICD-10-CM | POA: Diagnosis not present

## 2023-04-23 DIAGNOSIS — R300 Dysuria: Secondary | ICD-10-CM | POA: Diagnosis not present

## 2023-04-30 DIAGNOSIS — L570 Actinic keratosis: Secondary | ICD-10-CM | POA: Diagnosis not present

## 2023-04-30 DIAGNOSIS — D0461 Carcinoma in situ of skin of right upper limb, including shoulder: Secondary | ICD-10-CM | POA: Diagnosis not present

## 2023-05-10 DIAGNOSIS — M791 Myalgia, unspecified site: Secondary | ICD-10-CM | POA: Diagnosis not present

## 2023-05-10 DIAGNOSIS — M7062 Trochanteric bursitis, left hip: Secondary | ICD-10-CM | POA: Diagnosis not present

## 2023-06-16 DIAGNOSIS — M791 Myalgia, unspecified site: Secondary | ICD-10-CM | POA: Diagnosis not present

## 2023-06-18 ENCOUNTER — Other Ambulatory Visit: Payer: Self-pay | Admitting: Physical Medicine and Rehabilitation

## 2023-06-27 DIAGNOSIS — M545 Low back pain, unspecified: Secondary | ICD-10-CM | POA: Diagnosis not present

## 2023-07-12 DIAGNOSIS — M791 Myalgia, unspecified site: Secondary | ICD-10-CM | POA: Diagnosis not present

## 2023-07-12 DIAGNOSIS — M545 Low back pain, unspecified: Secondary | ICD-10-CM | POA: Diagnosis not present

## 2023-07-20 DIAGNOSIS — E785 Hyperlipidemia, unspecified: Secondary | ICD-10-CM | POA: Diagnosis not present

## 2023-07-26 DIAGNOSIS — Z23 Encounter for immunization: Secondary | ICD-10-CM | POA: Diagnosis not present

## 2023-07-29 DIAGNOSIS — H349 Unspecified retinal vascular occlusion: Secondary | ICD-10-CM | POA: Diagnosis not present

## 2023-07-29 DIAGNOSIS — Z961 Presence of intraocular lens: Secondary | ICD-10-CM | POA: Diagnosis not present

## 2023-07-29 DIAGNOSIS — H401131 Primary open-angle glaucoma, bilateral, mild stage: Secondary | ICD-10-CM | POA: Diagnosis not present

## 2023-08-03 DIAGNOSIS — H6121 Impacted cerumen, right ear: Secondary | ICD-10-CM | POA: Diagnosis not present

## 2023-08-05 DIAGNOSIS — D485 Neoplasm of uncertain behavior of skin: Secondary | ICD-10-CM | POA: Diagnosis not present

## 2023-08-05 DIAGNOSIS — L821 Other seborrheic keratosis: Secondary | ICD-10-CM | POA: Diagnosis not present

## 2023-08-05 DIAGNOSIS — L570 Actinic keratosis: Secondary | ICD-10-CM | POA: Diagnosis not present

## 2023-08-05 DIAGNOSIS — D2239 Melanocytic nevi of other parts of face: Secondary | ICD-10-CM | POA: Diagnosis not present

## 2023-08-05 DIAGNOSIS — D225 Melanocytic nevi of trunk: Secondary | ICD-10-CM | POA: Diagnosis not present

## 2023-08-05 DIAGNOSIS — D0461 Carcinoma in situ of skin of right upper limb, including shoulder: Secondary | ICD-10-CM | POA: Diagnosis not present

## 2023-08-16 DIAGNOSIS — M5441 Lumbago with sciatica, right side: Secondary | ICD-10-CM | POA: Diagnosis not present

## 2023-08-16 DIAGNOSIS — M5442 Lumbago with sciatica, left side: Secondary | ICD-10-CM | POA: Diagnosis not present

## 2023-08-29 DIAGNOSIS — M48062 Spinal stenosis, lumbar region with neurogenic claudication: Secondary | ICD-10-CM | POA: Diagnosis not present

## 2023-09-27 DIAGNOSIS — M48062 Spinal stenosis, lumbar region with neurogenic claudication: Secondary | ICD-10-CM | POA: Diagnosis not present

## 2023-10-25 DIAGNOSIS — M48062 Spinal stenosis, lumbar region with neurogenic claudication: Secondary | ICD-10-CM | POA: Diagnosis not present

## 2023-11-30 DIAGNOSIS — Z1231 Encounter for screening mammogram for malignant neoplasm of breast: Secondary | ICD-10-CM | POA: Diagnosis not present

## 2023-12-10 DIAGNOSIS — D485 Neoplasm of uncertain behavior of skin: Secondary | ICD-10-CM | POA: Diagnosis not present

## 2023-12-10 DIAGNOSIS — L814 Other melanin hyperpigmentation: Secondary | ICD-10-CM | POA: Diagnosis not present

## 2023-12-10 DIAGNOSIS — L570 Actinic keratosis: Secondary | ICD-10-CM | POA: Diagnosis not present

## 2023-12-23 DIAGNOSIS — C44529 Squamous cell carcinoma of skin of other part of trunk: Secondary | ICD-10-CM | POA: Diagnosis not present

## 2023-12-26 DIAGNOSIS — F419 Anxiety disorder, unspecified: Secondary | ICD-10-CM | POA: Diagnosis not present

## 2023-12-26 DIAGNOSIS — G8929 Other chronic pain: Secondary | ICD-10-CM | POA: Diagnosis not present

## 2023-12-26 DIAGNOSIS — M8580 Other specified disorders of bone density and structure, unspecified site: Secondary | ICD-10-CM | POA: Diagnosis not present

## 2023-12-26 DIAGNOSIS — K219 Gastro-esophageal reflux disease without esophagitis: Secondary | ICD-10-CM | POA: Diagnosis not present

## 2023-12-26 DIAGNOSIS — R42 Dizziness and giddiness: Secondary | ICD-10-CM | POA: Diagnosis not present

## 2023-12-26 DIAGNOSIS — I10 Essential (primary) hypertension: Secondary | ICD-10-CM | POA: Diagnosis not present

## 2023-12-26 DIAGNOSIS — E785 Hyperlipidemia, unspecified: Secondary | ICD-10-CM | POA: Diagnosis not present

## 2023-12-26 DIAGNOSIS — B009 Herpesviral infection, unspecified: Secondary | ICD-10-CM | POA: Diagnosis not present

## 2023-12-26 DIAGNOSIS — M810 Age-related osteoporosis without current pathological fracture: Secondary | ICD-10-CM | POA: Diagnosis not present

## 2023-12-26 DIAGNOSIS — F3341 Major depressive disorder, recurrent, in partial remission: Secondary | ICD-10-CM | POA: Diagnosis not present

## 2023-12-28 DIAGNOSIS — H401131 Primary open-angle glaucoma, bilateral, mild stage: Secondary | ICD-10-CM | POA: Diagnosis not present

## 2024-01-16 ENCOUNTER — Other Ambulatory Visit: Payer: Self-pay | Admitting: Physical Medicine and Rehabilitation

## 2024-01-20 ENCOUNTER — Other Ambulatory Visit: Payer: Self-pay | Admitting: Physical Medicine and Rehabilitation

## 2024-01-31 DIAGNOSIS — E785 Hyperlipidemia, unspecified: Secondary | ICD-10-CM | POA: Diagnosis not present

## 2024-02-07 DIAGNOSIS — I129 Hypertensive chronic kidney disease with stage 1 through stage 4 chronic kidney disease, or unspecified chronic kidney disease: Secondary | ICD-10-CM | POA: Diagnosis not present

## 2024-02-07 DIAGNOSIS — F325 Major depressive disorder, single episode, in full remission: Secondary | ICD-10-CM | POA: Diagnosis not present

## 2024-02-07 DIAGNOSIS — N182 Chronic kidney disease, stage 2 (mild): Secondary | ICD-10-CM | POA: Diagnosis not present

## 2024-02-07 DIAGNOSIS — E785 Hyperlipidemia, unspecified: Secondary | ICD-10-CM | POA: Diagnosis not present

## 2024-02-07 DIAGNOSIS — Z6822 Body mass index (BMI) 22.0-22.9, adult: Secondary | ICD-10-CM | POA: Diagnosis not present

## 2024-03-01 DIAGNOSIS — M79674 Pain in right toe(s): Secondary | ICD-10-CM | POA: Diagnosis not present

## 2024-03-01 DIAGNOSIS — L84 Corns and callosities: Secondary | ICD-10-CM | POA: Diagnosis not present

## 2024-03-01 DIAGNOSIS — M21611 Bunion of right foot: Secondary | ICD-10-CM | POA: Diagnosis not present

## 2024-03-05 DIAGNOSIS — M48062 Spinal stenosis, lumbar region with neurogenic claudication: Secondary | ICD-10-CM | POA: Diagnosis not present

## 2024-03-14 DIAGNOSIS — M48062 Spinal stenosis, lumbar region with neurogenic claudication: Secondary | ICD-10-CM | POA: Diagnosis not present

## 2024-03-21 DIAGNOSIS — Z6822 Body mass index (BMI) 22.0-22.9, adult: Secondary | ICD-10-CM | POA: Diagnosis not present

## 2024-03-21 DIAGNOSIS — N362 Urethral caruncle: Secondary | ICD-10-CM | POA: Diagnosis not present

## 2024-03-21 DIAGNOSIS — M48061 Spinal stenosis, lumbar region without neurogenic claudication: Secondary | ICD-10-CM | POA: Diagnosis not present

## 2024-03-21 DIAGNOSIS — N3941 Urge incontinence: Secondary | ICD-10-CM | POA: Diagnosis not present

## 2024-03-21 DIAGNOSIS — R32 Unspecified urinary incontinence: Secondary | ICD-10-CM | POA: Diagnosis not present

## 2024-03-30 DIAGNOSIS — L728 Other follicular cysts of the skin and subcutaneous tissue: Secondary | ICD-10-CM | POA: Diagnosis not present

## 2024-03-30 DIAGNOSIS — L814 Other melanin hyperpigmentation: Secondary | ICD-10-CM | POA: Diagnosis not present

## 2024-03-30 DIAGNOSIS — C44529 Squamous cell carcinoma of skin of other part of trunk: Secondary | ICD-10-CM | POA: Diagnosis not present

## 2024-03-30 DIAGNOSIS — C44622 Squamous cell carcinoma of skin of right upper limb, including shoulder: Secondary | ICD-10-CM | POA: Diagnosis not present

## 2024-04-07 DIAGNOSIS — N3091 Cystitis, unspecified with hematuria: Secondary | ICD-10-CM | POA: Diagnosis not present

## 2024-04-07 DIAGNOSIS — M79604 Pain in right leg: Secondary | ICD-10-CM | POA: Diagnosis not present

## 2024-04-07 DIAGNOSIS — R300 Dysuria: Secondary | ICD-10-CM | POA: Diagnosis not present

## 2024-04-09 DIAGNOSIS — M48062 Spinal stenosis, lumbar region with neurogenic claudication: Secondary | ICD-10-CM | POA: Diagnosis not present

## 2024-04-21 DIAGNOSIS — N898 Other specified noninflammatory disorders of vagina: Secondary | ICD-10-CM | POA: Diagnosis not present

## 2024-04-21 DIAGNOSIS — R350 Frequency of micturition: Secondary | ICD-10-CM | POA: Diagnosis not present

## 2024-04-21 DIAGNOSIS — N3000 Acute cystitis without hematuria: Secondary | ICD-10-CM | POA: Diagnosis not present

## 2024-04-21 DIAGNOSIS — R1030 Lower abdominal pain, unspecified: Secondary | ICD-10-CM | POA: Diagnosis not present

## 2024-04-21 DIAGNOSIS — R300 Dysuria: Secondary | ICD-10-CM | POA: Diagnosis not present

## 2024-04-30 DIAGNOSIS — R82998 Other abnormal findings in urine: Secondary | ICD-10-CM | POA: Diagnosis not present

## 2024-04-30 DIAGNOSIS — H6120 Impacted cerumen, unspecified ear: Secondary | ICD-10-CM | POA: Diagnosis not present

## 2024-04-30 DIAGNOSIS — R5383 Other fatigue: Secondary | ICD-10-CM | POA: Diagnosis not present

## 2024-04-30 DIAGNOSIS — J449 Chronic obstructive pulmonary disease, unspecified: Secondary | ICD-10-CM | POA: Diagnosis not present

## 2024-04-30 DIAGNOSIS — Z6822 Body mass index (BMI) 22.0-22.9, adult: Secondary | ICD-10-CM | POA: Diagnosis not present

## 2024-05-03 DIAGNOSIS — H6120 Impacted cerumen, unspecified ear: Secondary | ICD-10-CM | POA: Diagnosis not present

## 2024-05-30 DIAGNOSIS — H401131 Primary open-angle glaucoma, bilateral, mild stage: Secondary | ICD-10-CM | POA: Diagnosis not present

## 2024-05-30 DIAGNOSIS — H52203 Unspecified astigmatism, bilateral: Secondary | ICD-10-CM | POA: Diagnosis not present

## 2024-05-30 DIAGNOSIS — Z961 Presence of intraocular lens: Secondary | ICD-10-CM | POA: Diagnosis not present

## 2024-06-08 DIAGNOSIS — M48061 Spinal stenosis, lumbar region without neurogenic claudication: Secondary | ICD-10-CM | POA: Diagnosis not present

## 2024-06-08 DIAGNOSIS — M5416 Radiculopathy, lumbar region: Secondary | ICD-10-CM | POA: Diagnosis not present

## 2024-06-08 DIAGNOSIS — Z6822 Body mass index (BMI) 22.0-22.9, adult: Secondary | ICD-10-CM | POA: Diagnosis not present

## 2024-07-02 DIAGNOSIS — N390 Urinary tract infection, site not specified: Secondary | ICD-10-CM | POA: Diagnosis not present

## 2024-07-02 DIAGNOSIS — R82998 Other abnormal findings in urine: Secondary | ICD-10-CM | POA: Diagnosis not present

## 2024-07-02 DIAGNOSIS — Z6821 Body mass index (BMI) 21.0-21.9, adult: Secondary | ICD-10-CM | POA: Diagnosis not present

## 2024-07-02 DIAGNOSIS — Z23 Encounter for immunization: Secondary | ICD-10-CM | POA: Diagnosis not present

## 2024-07-26 DIAGNOSIS — L245 Irritant contact dermatitis due to other chemical products: Secondary | ICD-10-CM | POA: Diagnosis not present

## 2024-07-26 DIAGNOSIS — L821 Other seborrheic keratosis: Secondary | ICD-10-CM | POA: Diagnosis not present

## 2024-07-26 DIAGNOSIS — L570 Actinic keratosis: Secondary | ICD-10-CM | POA: Diagnosis not present

## 2024-08-03 DIAGNOSIS — L820 Inflamed seborrheic keratosis: Secondary | ICD-10-CM | POA: Diagnosis not present

## 2024-08-03 DIAGNOSIS — L570 Actinic keratosis: Secondary | ICD-10-CM | POA: Diagnosis not present

## 2024-08-03 DIAGNOSIS — L209 Atopic dermatitis, unspecified: Secondary | ICD-10-CM | POA: Diagnosis not present

## 2024-08-07 DIAGNOSIS — E785 Hyperlipidemia, unspecified: Secondary | ICD-10-CM | POA: Diagnosis not present

## 2024-08-14 DIAGNOSIS — N8110 Cystocele, unspecified: Secondary | ICD-10-CM | POA: Diagnosis not present

## 2024-08-14 DIAGNOSIS — Z1389 Encounter for screening for other disorder: Secondary | ICD-10-CM | POA: Diagnosis not present

## 2024-08-14 DIAGNOSIS — Z1339 Encounter for screening examination for other mental health and behavioral disorders: Secondary | ICD-10-CM | POA: Diagnosis not present

## 2024-08-14 DIAGNOSIS — Z0000 Encounter for general adult medical examination without abnormal findings: Secondary | ICD-10-CM | POA: Diagnosis not present

## 2024-08-14 DIAGNOSIS — Z136 Encounter for screening for cardiovascular disorders: Secondary | ICD-10-CM | POA: Diagnosis not present

## 2024-08-14 DIAGNOSIS — N3941 Urge incontinence: Secondary | ICD-10-CM | POA: Diagnosis not present

## 2024-08-14 DIAGNOSIS — R300 Dysuria: Secondary | ICD-10-CM | POA: Diagnosis not present

## 2024-08-14 DIAGNOSIS — Z6822 Body mass index (BMI) 22.0-22.9, adult: Secondary | ICD-10-CM | POA: Diagnosis not present

## 2024-08-14 DIAGNOSIS — Z139 Encounter for screening, unspecified: Secondary | ICD-10-CM | POA: Diagnosis not present

## 2024-08-14 DIAGNOSIS — Z1331 Encounter for screening for depression: Secondary | ICD-10-CM | POA: Diagnosis not present

## 2024-09-26 DIAGNOSIS — H6123 Impacted cerumen, bilateral: Secondary | ICD-10-CM | POA: Diagnosis not present

## 2024-09-26 DIAGNOSIS — R42 Dizziness and giddiness: Secondary | ICD-10-CM | POA: Diagnosis not present

## 2024-09-26 DIAGNOSIS — Z6822 Body mass index (BMI) 22.0-22.9, adult: Secondary | ICD-10-CM | POA: Diagnosis not present

## 2024-11-23 ENCOUNTER — Encounter
